# Patient Record
Sex: Female | Born: 1935
Health system: Southern US, Community
[De-identification: ages and names within clinical notes are randomized; demographics above are authoritative.]

## PROBLEM LIST (undated history)

## (undated) DIAGNOSIS — M21372 Foot drop, left foot: Secondary | ICD-10-CM

## (undated) DIAGNOSIS — I4891 Unspecified atrial fibrillation: Secondary | ICD-10-CM

## (undated) DIAGNOSIS — G629 Polyneuropathy, unspecified: Secondary | ICD-10-CM

## (undated) DIAGNOSIS — G459 Transient cerebral ischemic attack, unspecified: Secondary | ICD-10-CM

## (undated) DIAGNOSIS — J449 Chronic obstructive pulmonary disease, unspecified: Secondary | ICD-10-CM

## (undated) HISTORY — PX: JOINT REPLACEMENT: SHX530

## (undated) HISTORY — PX: BACK SURGERY: SHX140

## (undated) HISTORY — PX: HIP SURGERY: SHX245

---

## 2017-05-03 ENCOUNTER — Emergency Department (HOSPITAL_COMMUNITY): Payer: Medicare Other

## 2017-05-03 ENCOUNTER — Inpatient Hospital Stay (HOSPITAL_COMMUNITY)
Admission: EM | Admit: 2017-05-03 | Discharge: 2017-05-11 | DRG: 690 | Disposition: A | Discharge status: Skilled Nursing Facility | Payer: Medicare Other | Attending: Internal Medicine | Admitting: Internal Medicine

## 2017-05-03 ENCOUNTER — Encounter (HOSPITAL_COMMUNITY): Payer: Self-pay | Admitting: *Deleted

## 2017-05-03 DIAGNOSIS — S43004A Unspecified dislocation of right shoulder joint, initial encounter: Secondary | ICD-10-CM

## 2017-05-03 DIAGNOSIS — Z823 Family history of stroke: Secondary | ICD-10-CM

## 2017-05-03 DIAGNOSIS — W010XXA Fall on same level from slipping, tripping and stumbling without subsequent striking against object, initial encounter: Secondary | ICD-10-CM | POA: Diagnosis present

## 2017-05-03 DIAGNOSIS — N3000 Acute cystitis without hematuria: Principal | ICD-10-CM | POA: Diagnosis present

## 2017-05-03 DIAGNOSIS — S43035A Inferior dislocation of left humerus, initial encounter: Secondary | ICD-10-CM | POA: Diagnosis present

## 2017-05-03 DIAGNOSIS — J449 Chronic obstructive pulmonary disease, unspecified: Secondary | ICD-10-CM | POA: Diagnosis present

## 2017-05-03 DIAGNOSIS — R471 Dysarthria and anarthria: Secondary | ICD-10-CM | POA: Diagnosis not present

## 2017-05-03 DIAGNOSIS — D72829 Elevated white blood cell count, unspecified: Secondary | ICD-10-CM

## 2017-05-03 DIAGNOSIS — Z66 Do not resuscitate: Secondary | ICD-10-CM | POA: Diagnosis present

## 2017-05-03 DIAGNOSIS — G9341 Metabolic encephalopathy: Secondary | ICD-10-CM | POA: Diagnosis present

## 2017-05-03 DIAGNOSIS — T424X5A Adverse effect of benzodiazepines, initial encounter: Secondary | ICD-10-CM | POA: Diagnosis not present

## 2017-05-03 DIAGNOSIS — G629 Polyneuropathy, unspecified: Secondary | ICD-10-CM | POA: Diagnosis present

## 2017-05-03 DIAGNOSIS — S43005A Unspecified dislocation of left shoulder joint, initial encounter: Secondary | ICD-10-CM

## 2017-05-03 DIAGNOSIS — G5693 Unspecified mononeuropathy of bilateral upper limbs: Secondary | ICD-10-CM

## 2017-05-03 DIAGNOSIS — I4581 Long QT syndrome: Secondary | ICD-10-CM | POA: Diagnosis present

## 2017-05-03 DIAGNOSIS — S143XXA Injury of brachial plexus, initial encounter: Secondary | ICD-10-CM | POA: Diagnosis present

## 2017-05-03 DIAGNOSIS — M21372 Foot drop, left foot: Secondary | ICD-10-CM | POA: Diagnosis present

## 2017-05-03 DIAGNOSIS — R451 Restlessness and agitation: Secondary | ICD-10-CM | POA: Clinically undetermined

## 2017-05-03 DIAGNOSIS — Y92481 Parking lot as the place of occurrence of the external cause: Secondary | ICD-10-CM

## 2017-05-03 DIAGNOSIS — S43006A Unspecified dislocation of unspecified shoulder joint, initial encounter: Secondary | ICD-10-CM

## 2017-05-03 DIAGNOSIS — W19XXXA Unspecified fall, initial encounter: Secondary | ICD-10-CM

## 2017-05-03 DIAGNOSIS — K5900 Constipation, unspecified: Secondary | ICD-10-CM | POA: Diagnosis not present

## 2017-05-03 DIAGNOSIS — Z882 Allergy status to sulfonamides status: Secondary | ICD-10-CM

## 2017-05-03 DIAGNOSIS — I472 Ventricular tachycardia: Secondary | ICD-10-CM | POA: Diagnosis not present

## 2017-05-03 DIAGNOSIS — F0390 Unspecified dementia without behavioral disturbance: Secondary | ICD-10-CM | POA: Diagnosis present

## 2017-05-03 DIAGNOSIS — R4701 Aphasia: Secondary | ICD-10-CM | POA: Diagnosis not present

## 2017-05-03 DIAGNOSIS — I69354 Hemiplegia and hemiparesis following cerebral infarction affecting left non-dominant side: Secondary | ICD-10-CM

## 2017-05-03 DIAGNOSIS — N390 Urinary tract infection, site not specified: Secondary | ICD-10-CM | POA: Diagnosis not present

## 2017-05-03 DIAGNOSIS — Z7902 Long term (current) use of antithrombotics/antiplatelets: Secondary | ICD-10-CM

## 2017-05-03 DIAGNOSIS — F05 Delirium due to known physiological condition: Secondary | ICD-10-CM | POA: Clinically undetermined

## 2017-05-03 DIAGNOSIS — E785 Hyperlipidemia, unspecified: Secondary | ICD-10-CM | POA: Diagnosis present

## 2017-05-03 DIAGNOSIS — Z880 Allergy status to penicillin: Secondary | ICD-10-CM

## 2017-05-03 DIAGNOSIS — R410 Disorientation, unspecified: Secondary | ICD-10-CM | POA: Diagnosis not present

## 2017-05-03 DIAGNOSIS — Z885 Allergy status to narcotic agent status: Secondary | ICD-10-CM

## 2017-05-03 DIAGNOSIS — I10 Essential (primary) hypertension: Secondary | ICD-10-CM | POA: Diagnosis present

## 2017-05-03 DIAGNOSIS — R4182 Altered mental status, unspecified: Secondary | ICD-10-CM

## 2017-05-03 DIAGNOSIS — E876 Hypokalemia: Secondary | ICD-10-CM | POA: Diagnosis present

## 2017-05-03 DIAGNOSIS — S43014A Anterior dislocation of right humerus, initial encounter: Secondary | ICD-10-CM | POA: Diagnosis present

## 2017-05-03 DIAGNOSIS — I471 Supraventricular tachycardia: Secondary | ICD-10-CM | POA: Diagnosis not present

## 2017-05-03 HISTORY — DX: Foot drop, left foot: M21.372

## 2017-05-03 HISTORY — DX: Polyneuropathy, unspecified: G62.9

## 2017-05-03 HISTORY — DX: Unspecified atrial fibrillation: I48.91

## 2017-05-03 HISTORY — DX: Transient cerebral ischemic attack, unspecified: G45.9

## 2017-05-03 HISTORY — DX: Chronic obstructive pulmonary disease, unspecified: J44.9

## 2017-05-03 LAB — I-STAT TROPONIN, ED: Troponin i, poc: 0.01 ng/mL (ref 0.00–0.08)

## 2017-05-03 LAB — I-STAT CHEM 8, ED
BUN: 19 mg/dL (ref 6–20)
CALCIUM ION: 1.17 mmol/L (ref 1.15–1.40)
Chloride: 105 mmol/L (ref 101–111)
Creatinine, Ser: 0.8 mg/dL (ref 0.44–1.00)
Glucose, Bld: 198 mg/dL — ABNORMAL HIGH (ref 65–99)
HEMATOCRIT: 36 % (ref 36.0–46.0)
HEMOGLOBIN: 12.2 g/dL (ref 12.0–15.0)
Potassium: 3.3 mmol/L — ABNORMAL LOW (ref 3.5–5.1)
SODIUM: 142 mmol/L (ref 135–145)
TCO2: 20 mmol/L (ref 0–100)

## 2017-05-03 LAB — URINALYSIS, ROUTINE W REFLEX MICROSCOPIC
Bilirubin Urine: NEGATIVE
Glucose, UA: 150 mg/dL — AB
HGB URINE DIPSTICK: NEGATIVE
Ketones, ur: 20 mg/dL — AB
Leukocytes, UA: NEGATIVE
NITRITE: POSITIVE — AB
PROTEIN: NEGATIVE mg/dL
RBC / HPF: NONE SEEN RBC/hpf (ref 0–5)
SPECIFIC GRAVITY, URINE: 1.014 (ref 1.005–1.030)
Squamous Epithelial / LPF: NONE SEEN
pH: 6 (ref 5.0–8.0)

## 2017-05-03 LAB — COMPREHENSIVE METABOLIC PANEL
ALT: 14 U/L (ref 14–54)
AST: 22 U/L (ref 15–41)
Albumin: 3.4 g/dL — ABNORMAL LOW (ref 3.5–5.0)
Alkaline Phosphatase: 75 U/L (ref 38–126)
Anion gap: 10 (ref 5–15)
BUN: 16 mg/dL (ref 6–20)
CALCIUM: 8.6 mg/dL — AB (ref 8.9–10.3)
CO2: 21 mmol/L — ABNORMAL LOW (ref 22–32)
CREATININE: 0.96 mg/dL (ref 0.44–1.00)
Chloride: 108 mmol/L (ref 101–111)
GFR, EST NON AFRICAN AMERICAN: 53 mL/min — AB (ref >60)
Glucose, Bld: 195 mg/dL — ABNORMAL HIGH (ref 65–99)
Potassium: 3.4 mmol/L — ABNORMAL LOW (ref 3.5–5.1)
SODIUM: 139 mmol/L (ref 135–145)
Total Bilirubin: 0.6 mg/dL (ref 0.3–1.2)
Total Protein: 5.7 g/dL — ABNORMAL LOW (ref 6.5–8.1)

## 2017-05-03 LAB — APTT: aPTT: 28 seconds (ref 24–36)

## 2017-05-03 LAB — ETHANOL: Alcohol, Ethyl (B): <5 mg/dL (ref <5)

## 2017-05-03 LAB — DIFFERENTIAL
Basophils Absolute: 0 10*3/uL (ref 0.0–0.1)
Basophils Relative: 0 %
EOS PCT: 3 %
Eosinophils Absolute: 0.2 10*3/uL (ref 0.0–0.7)
LYMPHS ABS: 1.3 10*3/uL (ref 0.7–4.0)
LYMPHS PCT: 17 %
Monocytes Absolute: 0.3 10*3/uL (ref 0.1–1.0)
Monocytes Relative: 4 %
NEUTROS ABS: 6.2 10*3/uL (ref 1.7–7.7)
NEUTROS PCT: 76 %

## 2017-05-03 LAB — CBC
HCT: 36.7 % (ref 36.0–46.0)
Hemoglobin: 12.3 g/dL (ref 12.0–15.0)
MCH: 29.1 pg (ref 26.0–34.0)
MCHC: 33.5 g/dL (ref 30.0–36.0)
MCV: 87 fL (ref 78.0–100.0)
PLATELETS: 264 10*3/uL (ref 150–400)
RBC: 4.22 MIL/uL (ref 3.87–5.11)
RDW: 13 % (ref 11.5–15.5)
WBC: 8.2 10*3/uL (ref 4.0–10.5)

## 2017-05-03 LAB — RAPID URINE DRUG SCREEN, HOSP PERFORMED
AMPHETAMINES: NOT DETECTED
BENZODIAZEPINES: NOT DETECTED
Barbiturates: NOT DETECTED
Cocaine: NOT DETECTED
OPIATES: NOT DETECTED
TETRAHYDROCANNABINOL: NOT DETECTED

## 2017-05-03 LAB — PROTIME-INR
INR: 1.01
PROTHROMBIN TIME: 13.3 s (ref 11.4–15.2)

## 2017-05-03 MED ORDER — ACETAMINOPHEN 650 MG RE SUPP: 650.0000 mg | Freq: Four times a day (QID) | RECTAL | Status: DC | PRN

## 2017-05-03 MED ORDER — SODIUM CHLORIDE 0.9 % IV BOLUS (SEPSIS)
500.0000 mL | Freq: Once | INTRAVENOUS | Status: AC
  Administered 2017-05-03: 500 mL via INTRAVENOUS

## 2017-05-03 MED ORDER — LABETALOL HCL 5 MG/ML IV SOLN
10.0000 mg | INTRAVENOUS | Status: DC | PRN
  Filled 2017-05-03: qty 4

## 2017-05-03 MED ORDER — SODIUM CHLORIDE 0.9 % IV SOLN
INTRAVENOUS | Status: AC
  Administered 2017-05-03: 20:00:00 via INTRAVENOUS

## 2017-05-03 MED ORDER — HYDROCODONE-ACETAMINOPHEN 5-325 MG PO TABS
1.0000 | ORAL_TABLET | ORAL | Status: DC | PRN
  Administered 2017-05-04: 1 via ORAL
  Filled 2017-05-03: qty 1

## 2017-05-03 MED ORDER — DEXTROSE 5 % IV SOLN
1.0000 g | Freq: Every day | INTRAVENOUS | Status: DC
  Administered 2017-05-04 – 2017-05-07 (×5): 1 g via INTRAVENOUS
  Filled 2017-05-03 (×6): qty 10

## 2017-05-03 MED ORDER — POTASSIUM CHLORIDE CRYS ER 20 MEQ PO TBCR
40.0000 meq | EXTENDED_RELEASE_TABLET | Freq: Once | ORAL | Status: AC
  Administered 2017-05-04: 40 meq via ORAL
  Filled 2017-05-03: qty 2

## 2017-05-03 MED ORDER — ONDANSETRON HCL 4 MG PO TABS: 4.0000 mg | ORAL_TABLET | Freq: Four times a day (QID) | ORAL | Status: DC | PRN

## 2017-05-03 MED ORDER — PROPOFOL 10 MG/ML IV BOLUS
0.5000 mg/kg | Freq: Once | INTRAVENOUS | Status: DC | PRN
  Administered 2017-05-03: 35.9 mg via INTRAVENOUS
  Filled 2017-05-03: qty 20

## 2017-05-03 MED ORDER — SODIUM CHLORIDE 0.9% FLUSH
3.0000 mL | Freq: Two times a day (BID) | INTRAVENOUS | Status: DC
  Administered 2017-05-04 – 2017-05-10 (×14): 3 mL via INTRAVENOUS

## 2017-05-03 MED ORDER — HYDROMORPHONE HCL 1 MG/ML IJ SOLN
0.5000 mg | Freq: Once | INTRAMUSCULAR | Status: AC
Start: 2017-05-03 — End: 2017-05-03
  Administered 2017-05-03: 0.5 mg via INTRAVENOUS
  Filled 2017-05-03: qty 1

## 2017-05-03 MED ORDER — PROPOFOL 10 MG/ML IV BOLUS
INTRAVENOUS | Status: AC | PRN
  Administered 2017-05-03: 40 mg via INTRAVENOUS

## 2017-05-03 MED ORDER — ACETAMINOPHEN 325 MG PO TABS
650.0000 mg | ORAL_TABLET | Freq: Four times a day (QID) | ORAL | Status: DC | PRN
  Administered 2017-05-11: 650 mg via ORAL
  Filled 2017-05-03: qty 2

## 2017-05-03 MED ORDER — ONDANSETRON HCL 4 MG/2ML IJ SOLN: 4.0000 mg | Freq: Four times a day (QID) | INTRAMUSCULAR | Status: DC | PRN

## 2017-05-03 MED ORDER — MAGNESIUM SULFATE IN D5W 1-5 GM/100ML-% IV SOLN
1.0000 g | Freq: Once | INTRAVENOUS | Status: AC
  Administered 2017-05-04: 1 g via INTRAVENOUS
  Filled 2017-05-03 (×2): qty 100

## 2017-05-03 NOTE — Sedation Documentation (Signed)
R shoulder reduction

## 2017-05-03 NOTE — Progress Notes (Signed)
RRT at bedside for conscious sedation for a bilateral shoulder reduction. No complications noted.

## 2017-05-03 NOTE — Sedation Documentation (Signed)
Left shoulder reduction

## 2017-05-03 NOTE — H&P (Signed)
History and Physical    Kristin Conrad AVW:[id number] DOB: [birth year] DOA: 05/03/2017  PCP: System, Pcp Not In   Patient coming from: Brought in by EMS  Chief Complaint: Fall in the parking lot of [employer]  HPI: Kristin Conrad is a 83 y.o. woman with a history of TIA, COPD, peripheral neuropathy, and left foot drop who was in her baseline state of health until she had a mechanical fall outside of [employer] today.  The fall was witnessed by her daughter, who reports that the patient has chronic bilateral lower extremity weakness.  There was no loss of consciousness.  No convulsions or seizure-like activity.  No bowel or bladder incontinence.  She immediately had bilateral shoulder pain; she reports that she continued to hold on to the shopping cart while she was going to the ground.  EMS was called.  She received fentanyl and NS 500cc en route.  ED Course: EKG shows NSR; no acute ST elevation.  CT head and cervical spine negative for acute process.  Xray showed bilateral shoulder dislocations.  U/A is nitrite positive with many bacteria.  Normal CBC.  Potassium 3.4.  UDS negative.    Both shoulders have been reduced in the ED, with conscious sedation.  She has had mild delirium in the ED.  She will need to have her arms in slings.  There is concern for safety at home (ability to climbs stairs, perform ADLs).    IV Rocephin, oral potassium, and empiric magnesium ordered.  Hospitalist asked to place in observation.  PT and OT evaluations pending for the morning.  Review of Systems: As per HPI otherwise 10 systems reviewed and negative.   Past Medical History:  Diagnosis Date  . COPD (chronic obstructive pulmonary disease) (HCC)   . Left foot drop   . Lone atrial fibrillation (HCC)   . Peripheral neuropathy   . TIA (transient ischemic attack)     Past Surgical History:  Procedure Laterality Date  . BACK SURGERY    . HIP SURGERY    . JOINT REPLACEMENT    She has had cervical and  lumbar spine surgeries.   reports that she has never smoked. She has never used smokeless tobacco. She reports that she does not drink alcohol or use drugs.  She is a widow.  She has three adult children.  Allergies  Allergen Reactions  . Codeine   . Penicillins Other (See Comments)    childhood  . Sulfa Antibiotics Other (See Comments)    Childhood     Family History  Problem Relation Age of Onset  . Stroke Father      Prior to Admission medications   Not on File  Home medications reviewed after they were entered by pharmacy.  Physical Exam: Vitals:   05/03/17 2100 05/03/17 2130 05/03/17 2200 05/03/17 2230  BP: (!) 187/98 (!) 181/88 (!) 159/83 (!) 176/93  Pulse: (!) 103 (!) 101 99 (!) 103  Resp: 16 (!) 26 13 19   Temp:      SpO2: 100% 97% 99% 98%  Weight:      Height:          Constitutional: NAD, calm, comfortable, mild delirium after conscious sedation for bilateral shoulder reduction Vitals:   05/03/17 2100 05/03/17 2130 05/03/17 2200 05/03/17 2230  BP: (!) 187/98 (!) 181/88 (!) 159/83 (!) 176/93  Pulse: (!) 103 (!) 101 99 (!) 103  Resp: 16 (!) 26 13 19   Temp:      SpO2: 100% 97%  99% 98%  Weight:      Height:       Eyes: PERRL, lids and conjunctivae normal ENMT: Mucous membranes are very dry. Posterior pharynx not completely visualized. Normal dentition.  Neck: normal appearance, supple, no masses Respiratory: clear to auscultation listening anteriorly.  No wheezing.  Normal respiratory effort. No accessory muscle use.  Cardiovascular: Tachycardic but regular.  No murmurs / rubs / gallops. No extremity edema. 2+ pedal pulses.  GI: abdomen is soft and compressible.  No distention.  No tenderness.  Bowel sounds are present. Musculoskeletal:  No joint deformity in upper and lower extremities. Reduced ROM in bilateral upper extremities.  No contractures. Normal muscle tone.  Skin: no rashes, warm and dry Neurologic: No focal deficits. Psychiatric: Mildly  confused (after conscious sedation) in the ED but mood appropriate.    Labs on Admission: I have personally reviewed following labs and imaging studies  CBC:  Recent Labs Lab 05/03/17 1657 05/03/17 1709  WBC 8.2  --   NEUTROABS 6.2  --   HGB 12.3 12.2  HCT 36.7 36.0  MCV 87.0  --   PLT 264  --    Basic Metabolic Panel:  Recent Labs Lab 05/03/17 1657 05/03/17 1709  NA 139 142  K 3.4* 3.3*  CL 108 105  CO2 21*  --   GLUCOSE 195* 198*  BUN 16 19  CREATININE 0.96 0.80  CALCIUM 8.6*  --    GFR: Estimated Creatinine Clearance: 49.7 mL/min (by C-G formula based on SCr of 0.8 mg/dL). Liver Function Tests:  Recent Labs Lab 05/03/17 1657  AST 22  ALT 14  ALKPHOS 75  BILITOT 0.6  PROT 5.7*  ALBUMIN 3.4*   Coagulation Profile:  Recent Labs Lab 05/03/17 1657  INR 1.01   Urine analysis:    Component Value Date/Time   COLORURINE YELLOW 05/03/2017 1820   APPEARANCEUR HAZY (A) 05/03/2017 1820   LABSPEC 1.014 05/03/2017 1820   PHURINE 6.0 05/03/2017 1820   GLUCOSEU 150 (A) 05/03/2017 1820   HGBUR NEGATIVE 05/03/2017 1820   BILIRUBINUR NEGATIVE 05/03/2017 1820   KETONESUR 20 (A) 05/03/2017 1820   PROTEINUR NEGATIVE 05/03/2017 1820   NITRITE POSITIVE (A) 05/03/2017 1820   LEUKOCYTESUR NEGATIVE 05/03/2017 1820    Radiological Exams on Admission: Dg Shoulder Right  Result Date: 05/03/2017 CLINICAL DATA:  Dislocation, postreduction. EXAM: RIGHT SHOULDER - 2+ VIEW COMPARISON:  Pre reduction exam earlier this day. FINDINGS: Previous dislocation of the right shoulder is been reduced, alignment appears anatomic. No large Hill-Sachs impaction fracture. Acromioclavicular joint is congruent. IMPRESSION: Reduction of prior anterior shoulder dislocation. No fracture or Hill-Sachs impaction injury is visualized. Electronically Signed   By: Rubye Oaks M.D.   On: 05/03/2017 22:14   Dg Shoulder Right  Addendum Date: 05/03/2017   ADDENDUM REPORT: 05/03/2017 19:54  ADDENDUM: Corrected report: IMPRESSION: Dislocation of the right shoulder. Electronically Signed   By: Norva Pavlov M.D.   On: 05/03/2017 19:54   Result Date: 05/03/2017 CLINICAL DATA:  Pain both shoulders s/p fall. Unable to get y views or transthoracic. Due to pt.'s condition could only get ap images EXAM: RIGHT SHOULDER - 2+ VIEW COMPARISON:  None. FINDINGS: There is anterior dislocation of the right shoulder. Mechanism is appropriate for Hill-Sachs deformity. No acute fracture identified. Right lung apex is clear. IMPRESSION: Negative. Electronically Signed: By: Norva Pavlov M.D. On: 05/03/2017 19:33   Ct Head Wo Contrast  Result Date: 05/03/2017 CLINICAL DATA:  Pt fell today , Tripped at  [employer] and landed on hands, chin and chest. EXAM: CT HEAD WITHOUT CONTRAST CT CERVICAL SPINE WITHOUT CONTRAST TECHNIQUE: Multidetector CT imaging of the head and cervical spine was performed following the standard protocol without intravenous contrast. Multiplanar CT image reconstructions of the cervical spine were also generated. COMPARISON:  None. FINDINGS: CT HEAD FINDINGS Brain: No evidence of acute infarction, hemorrhage, hydrocephalus, extra-axial collection or mass lesion/mass effect. The ventricles are normal configuration. There is ventricular and sulcal enlargement reflecting mild diffuse atrophy. Patchy white matter hypoattenuation is also noted consistent with moderate chronic microvascular ischemic change. Vascular: No hyperdense vessel or unexpected calcification. Skull: Normal. Negative for fracture or focal lesion. Sinuses/Orbits: Globes and orbits are unremarkable. Visualized sinuses and mastoid air cells are clear. Other: None. CT CERVICAL SPINE FINDINGS Alignment: There is a grade 1 anterolisthesis of C5 on C6 and of C6 on C7. No other spondylolisthesis. Skull base and vertebrae: No acute fracture. No primary bone lesion or focal pathologic process. Soft tissues and spinal canal: No prevertebral  fluid or swelling. No visible canal hematoma. Disc levels: Bilateral laminectomies have been performed from C3 through C6. There is mild loss of disc height at C5-C6 with moderate to marked loss of disc height at C6-C7. Mild to moderate loss disc height at C7-T1. There are facet degenerative changes bilaterally throughout cervical spine neural foraminal narrowing is noted at multiple levels greatest bilaterally at C6-C7, severe. There is no convincing disc herniation. Upper chest: No acute findings. There are nodule arising from the left thyroid lobe, largest measuring 2.3 cm. Right lobe is not visualized and may have been surgically removed. Other: None IMPRESSION: HEAD CT:  No acute intracranial abnormalities.  No skull fracture. CERVICAL CT: No fracture or acute finding. Degenerative and postsurgical changes as detailed. Electronically Signed   By: Amie Portland M.D.   On: 05/03/2017 18:37   Ct Cervical Spine Wo Contrast  Result Date: 05/03/2017 CLINICAL DATA:  Pt fell today , Tripped at [employer] and landed on hands, chin and chest. EXAM: CT HEAD WITHOUT CONTRAST CT CERVICAL SPINE WITHOUT CONTRAST TECHNIQUE: Multidetector CT imaging of the head and cervical spine was performed following the standard protocol without intravenous contrast. Multiplanar CT image reconstructions of the cervical spine were also generated. COMPARISON:  None. FINDINGS: CT HEAD FINDINGS Brain: No evidence of acute infarction, hemorrhage, hydrocephalus, extra-axial collection or mass lesion/mass effect. The ventricles are normal configuration. There is ventricular and sulcal enlargement reflecting mild diffuse atrophy. Patchy white matter hypoattenuation is also noted consistent with moderate chronic microvascular ischemic change. Vascular: No hyperdense vessel or unexpected calcification. Skull: Normal. Negative for fracture or focal lesion. Sinuses/Orbits: Globes and orbits are unremarkable. Visualized sinuses and mastoid air cells are  clear. Other: None. CT CERVICAL SPINE FINDINGS Alignment: There is a grade 1 anterolisthesis of C5 on C6 and of C6 on C7. No other spondylolisthesis. Skull base and vertebrae: No acute fracture. No primary bone lesion or focal pathologic process. Soft tissues and spinal canal: No prevertebral fluid or swelling. No visible canal hematoma. Disc levels: Bilateral laminectomies have been performed from C3 through C6. There is mild loss of disc height at C5-C6 with moderate to marked loss of disc height at C6-C7. Mild to moderate loss disc height at C7-T1. There are facet degenerative changes bilaterally throughout cervical spine neural foraminal narrowing is noted at multiple levels greatest bilaterally at C6-C7, severe. There is no convincing disc herniation. Upper chest: No acute findings. There are nodule arising from the left thyroid lobe,  largest measuring 2.3 cm. Right lobe is not visualized and may have been surgically removed. Other: None IMPRESSION: HEAD CT:  No acute intracranial abnormalities.  No skull fracture. CERVICAL CT: No fracture or acute finding. Degenerative and postsurgical changes as detailed. Electronically Signed   By: Amie Portlandavid  Ormond M.D.   On: 05/03/2017 18:37   Dg Shoulder Left  Result Date: 05/03/2017 CLINICAL DATA:  Dislocation, postreduction. EXAM: LEFT SHOULDER - 2+ VIEW COMPARISON:  Pre reduction radiographs earlier this day. FINDINGS: Shoulder dislocation has been reduced, alignment is now anatomic. The previous questioned glenoid fracture on prior exam is not visualized, may be obscured by osseous overlap. Acromioclavicular joint is congruent. IMPRESSION: Anatomic alignment postreduction. The previous questioned glenoid fracture is not visualized, may be obscured by osseous overlap. Electronically Signed   By: Rubye OaksMelanie  Ehinger M.D.   On: 05/03/2017 22:16   Dg Shoulder Left  Result Date: 05/03/2017 CLINICAL DATA:  Fall, pain EXAM: LEFT SHOULDER - 2+ VIEW COMPARISON:  None.  FINDINGS: AC joint appears intact.  The left lung apex is clear. Inferior dislocation of the left humeral head with respect to the glenoid fossa. Possible cortical fracture along the mid to inferior glenoid rim. IMPRESSION: Inferior dislocation of the left humeral head with respect to the glenoid fossa. Possible small fracture fragments along the mid to inferior glenoid fossa. Electronically Signed   By: Jasmine PangKim  Fujinaga M.D.   On: 05/03/2017 19:34    EKG: Independently reviewed. Noted above.  Assessment/Plan Principal Problem:   Fall Active Problems:   Shoulder dislocation   Hypokalemia   COPD (chronic obstructive pulmonary disease) (HCC)   Acute lower UTI   Delirium      Mechanical fall with bilateral shoulder dislocation S/P reduction with conscious sedation --Observation with telemetry --PT and OT eval and treat --Analgesics as needed  UTI --Rocephin --Urine culture  Hypokalemia --Replacement ordered in the ED with empiric magnesium 1 gram IV --BMP in the AM  Peripheral neuropathy --Neurontin, flexeril, celebrex  Prior TIA --Plavix three times per week   DVT prophylaxis: Low risk, outpatient status Code Status: DNR Family Communication: Daughter present in the ED at time of admission. Disposition Plan: To be determined. Consults called: NONE Admission status: Place in observation with telemetry monitoring   TIME SPENT: 60 minutes   Jerene Bearsarter,Karlis Cregg Harrison MD [facility] Pager [phone]  If 7PM-7AM, please contact night-coverage [email or web address] Password [email or web address]  05/03/2017, 11:26 PM

## 2017-05-03 NOTE — Sedation Documentation (Signed)
Patient denies pain and is resting comfortably.  

## 2017-05-03 NOTE — ED Notes (Signed)
EKG given to Dr. Mesner. 

## 2017-05-03 NOTE — ED Notes (Signed)
Attempted report 

## 2017-05-03 NOTE — ED Provider Notes (Signed)
[facility]-EMERGENCY DEPT Provider Note   CSN: [id number] Arrival date & time: 05/03/17  1524     History   Chief Complaint Chief Complaint  Patient presents with  . Fall    HPI Kristin Conrad is a 83 y.o. female presenting via EMS after a slip and fall outside of [employer]. Patient landed on her hands, and chest and was given fentanyl and fluids in route.  Daughter is in the room explaining that she witnessed her as she was pushing the cart outside of the store slowly sliding forward and hyperextending her back. She has a history of possible stroke 2 months ago with left-sided weakness residual, she is now on plavix. She has had left drop foot for 9 years. She reports bilateral arm and shoulder pains as well as upper back pain between her scapula. She denies neck pain, loss of consciousness, head trauma. Denies dizziness, nausea, vomiting, numbness or other symptoms.   HPI  Past Medical History:  Diagnosis Date  . Stroke [facility])    tia    There are no active problems to display for this patient.   Past Surgical History:  Procedure Laterality Date  . BACK SURGERY    . JOINT REPLACEMENT      OB History    No data available       Home Medications    Prior to Admission medications   Not on File    Family History No family history on file.  Social History Social History  Substance Use Topics  . Smoking status: Never Smoker  . Smokeless tobacco: Never Used  . Alcohol use No     Allergies   Patient has no known allergies.   Review of Systems Review of Systems  Constitutional: Negative for chills and fever.  HENT: Negative for ear pain, facial swelling and sore throat.   Eyes: Negative for pain and visual disturbance.  Respiratory: Negative for cough, shortness of breath, wheezing and stridor.   Cardiovascular: Negative for chest pain, palpitations and leg swelling.  Gastrointestinal: Negative for abdominal distention, abdominal pain, nausea and vomiting.   Denies loss of bowel function  Genitourinary:       Denies loss of bladder function  Musculoskeletal: Positive for arthralgias, gait problem and myalgias. Negative for back pain, joint swelling, neck pain and neck stiffness.  Skin: Negative for color change, pallor, rash and wound.  Neurological: Positive for weakness. Negative for dizziness, tremors, seizures, syncope, facial asymmetry, speech difficulty, light-headedness, numbness and headaches.     Physical Exam Updated Vital Signs BP (!) 173/78   Pulse 97   Temp 98 F (36.7 C)   Resp 16   Ht 5\' 3"  (1.6 m)   Wt 71.7 kg   SpO2 96%   BMI 27.99 kg/m   Physical Exam  Constitutional: She is oriented to person, place, and time. She appears well-developed and well-nourished. No distress.  HENT:  Head: Normocephalic and atraumatic.  Mouth/Throat: Oropharynx is clear and moist. No oropharyngeal exudate.  Eyes: Conjunctivae and EOM are normal. Pupils are equal, round, and reactive to light. Right eye exhibits no discharge. Left eye exhibits no discharge. No scleral icterus.  Neck: Normal range of motion. Neck supple.  Cardiovascular: Normal rate, regular rhythm, normal heart sounds and intact distal pulses.   No murmur heard. Pulmonary/Chest: Effort normal and breath sounds normal. No respiratory distress. She has no wheezes. She has no rales. She exhibits no tenderness.  Abdominal: Soft. She exhibits no distension and no mass. There  is no tenderness. There is no guarding.  Musculoskeletal: She exhibits tenderness. She exhibits no edema.  Neurological: She is alert and oriented to person, place, and time. No cranial nerve deficit or sensory deficit. She exhibits normal muscle tone.  Neurologic Exam:  - Mental status: Patient is alert and cooperative. Fluent speech and words are clear. Coherent thought processes and insight is good. Patient is oriented x 4 to person, place, time and event.  - Cranial nerves:  CN III, IV, VI: pupils  equally round, reactive to light both direct and conscensual and normal accommodation. Full extra-ocular movement.  CN VII : muscles of facial expression intact. CN X :  midline uvula. XI strength of sternocleidomastoid and trapezius muscles 5/5, XII: tongue is midline when protruded.  - Motor: No involuntary movements. Muscle tone and bulk normal throughout. Muscle strength is 5/5 in bilateral  hip extension, flexion, leg flexion and extension, ankle dorsiflexion (R) and plantar flexion.   Unable to dorsiflex left foot, baseline for patient. Bilateral weakness with grips R>L. unable to assess shoulder strength due to pain. - Sensory: Proprioception, light tough sensation intact in all extremities.    Skin: Skin is warm and dry. No rash noted. She is not diaphoretic. No erythema. No pallor.  Psychiatric: She has a normal mood and affect.  Nursing note and vitals reviewed.    ED Treatments / Results  Labs (all labs ordered are listed, but only abnormal results are displayed) Labs Reviewed  COMPREHENSIVE METABOLIC PANEL - Abnormal; Notable for the following:       Result Value   Potassium 3.4 (*)    CO2 21 (*)    Glucose, Bld 195 (*)    Calcium 8.6 (*)    Total Protein 5.7 (*)    Albumin 3.4 (*)    GFR calc non Af Amer 53 (*)    All other components within normal limits  URINALYSIS, ROUTINE W REFLEX MICROSCOPIC - Abnormal; Notable for the following:    APPearance HAZY (*)    Glucose, UA 150 (*)    Ketones, ur 20 (*)    Nitrite POSITIVE (*)    Bacteria, UA MANY (*)    All other components within normal limits  I-STAT CHEM 8, ED - Abnormal; Notable for the following:    Potassium 3.3 (*)    Glucose, Bld 198 (*)    All other components within normal limits  CBC  ETHANOL  PROTIME-INR  APTT  RAPID URINE DRUG SCREEN, HOSP PERFORMED  DIFFERENTIAL  I-STAT TROPOININ, ED    EKG  EKG Interpretation  Date/Time:  Monday May 03 2017 16:40:08 EDT Ventricular Rate:  89 PR  Interval:    QRS Duration: 102 QT Interval:  419 QTC Calculation: 510 R Axis:   -6 Text Interpretation:  Sinus rhythm Low voltage, precordial leads Abnormal R-wave progression, early transition Prolonged QT interval No old tracing to compare Confirmed by [facility] MD, JASON [phone]) on 05/03/2017 4:59:36 PM       Radiology Dg Shoulder Right  Addendum Date: 05/03/2017   ADDENDUM REPORT: 05/03/2017 19:54 ADDENDUM: Corrected report: IMPRESSION: Dislocation of the right shoulder. Electronically Signed   By: Norva Pavlov M.D.   On: 05/03/2017 19:54   Result Date: 05/03/2017 CLINICAL DATA:  Pain both shoulders s/p fall. Unable to get y views or transthoracic. Due to pt.'s condition could only get ap images EXAM: RIGHT SHOULDER - 2+ VIEW COMPARISON:  None. FINDINGS: There is anterior dislocation of the right shoulder. Mechanism is  appropriate for Hill-Sachs deformity. No acute fracture identified. Right lung apex is clear. IMPRESSION: Negative. Electronically Signed: By: Norva Pavlov M.D. On: 05/03/2017 19:33   Ct Head Wo Contrast  Result Date: 05/03/2017 CLINICAL DATA:  Pt fell today , Tripped at [employer] and landed on hands, chin and chest. EXAM: CT HEAD WITHOUT CONTRAST CT CERVICAL SPINE WITHOUT CONTRAST TECHNIQUE: Multidetector CT imaging of the head and cervical spine was performed following the standard protocol without intravenous contrast. Multiplanar CT image reconstructions of the cervical spine were also generated. COMPARISON:  None. FINDINGS: CT HEAD FINDINGS Brain: No evidence of acute infarction, hemorrhage, hydrocephalus, extra-axial collection or mass lesion/mass effect. The ventricles are normal configuration. There is ventricular and sulcal enlargement reflecting mild diffuse atrophy. Patchy white matter hypoattenuation is also noted consistent with moderate chronic microvascular ischemic change. Vascular: No hyperdense vessel or unexpected calcification. Skull: Normal. Negative for  fracture or focal lesion. Sinuses/Orbits: Globes and orbits are unremarkable. Visualized sinuses and mastoid air cells are clear. Other: None. CT CERVICAL SPINE FINDINGS Alignment: There is a grade 1 anterolisthesis of C5 on C6 and of C6 on C7. No other spondylolisthesis. Skull base and vertebrae: No acute fracture. No primary bone lesion or focal pathologic process. Soft tissues and spinal canal: No prevertebral fluid or swelling. No visible canal hematoma. Disc levels: Bilateral laminectomies have been performed from C3 through C6. There is mild loss of disc height at C5-C6 with moderate to marked loss of disc height at C6-C7. Mild to moderate loss disc height at C7-T1. There are facet degenerative changes bilaterally throughout cervical spine neural foraminal narrowing is noted at multiple levels greatest bilaterally at C6-C7, severe. There is no convincing disc herniation. Upper chest: No acute findings. There are nodule arising from the left thyroid lobe, largest measuring 2.3 cm. Right lobe is not visualized and may have been surgically removed. Other: None IMPRESSION: HEAD CT:  No acute intracranial abnormalities.  No skull fracture. CERVICAL CT: No fracture or acute finding. Degenerative and postsurgical changes as detailed. Electronically Signed   By: Amie Portland M.D.   On: 05/03/2017 18:37   Ct Cervical Spine Wo Contrast  Result Date: 05/03/2017 CLINICAL DATA:  Pt fell today , Tripped at [employer] and landed on hands, chin and chest. EXAM: CT HEAD WITHOUT CONTRAST CT CERVICAL SPINE WITHOUT CONTRAST TECHNIQUE: Multidetector CT imaging of the head and cervical spine was performed following the standard protocol without intravenous contrast. Multiplanar CT image reconstructions of the cervical spine were also generated. COMPARISON:  None. FINDINGS: CT HEAD FINDINGS Brain: No evidence of acute infarction, hemorrhage, hydrocephalus, extra-axial collection or mass lesion/mass effect. The ventricles are normal  configuration. There is ventricular and sulcal enlargement reflecting mild diffuse atrophy. Patchy white matter hypoattenuation is also noted consistent with moderate chronic microvascular ischemic change. Vascular: No hyperdense vessel or unexpected calcification. Skull: Normal. Negative for fracture or focal lesion. Sinuses/Orbits: Globes and orbits are unremarkable. Visualized sinuses and mastoid air cells are clear. Other: None. CT CERVICAL SPINE FINDINGS Alignment: There is a grade 1 anterolisthesis of C5 on C6 and of C6 on C7. No other spondylolisthesis. Skull base and vertebrae: No acute fracture. No primary bone lesion or focal pathologic process. Soft tissues and spinal canal: No prevertebral fluid or swelling. No visible canal hematoma. Disc levels: Bilateral laminectomies have been performed from C3 through C6. There is mild loss of disc height at C5-C6 with moderate to marked loss of disc height at C6-C7. Mild to moderate loss disc height  at C7-T1. There are facet degenerative changes bilaterally throughout cervical spine neural foraminal narrowing is noted at multiple levels greatest bilaterally at C6-C7, severe. There is no convincing disc herniation. Upper chest: No acute findings. There are nodule arising from the left thyroid lobe, largest measuring 2.3 cm. Right lobe is not visualized and may have been surgically removed. Other: None IMPRESSION: HEAD CT:  No acute intracranial abnormalities.  No skull fracture. CERVICAL CT: No fracture or acute finding. Degenerative and postsurgical changes as detailed. Electronically Signed   By: Amie Portland M.D.   On: 05/03/2017 18:37   Dg Shoulder Left  Result Date: 05/03/2017 CLINICAL DATA:  Fall, pain EXAM: LEFT SHOULDER - 2+ VIEW COMPARISON:  None. FINDINGS: AC joint appears intact.  The left lung apex is clear. Inferior dislocation of the left humeral head with respect to the glenoid fossa. Possible cortical fracture along the mid to inferior glenoid  rim. IMPRESSION: Inferior dislocation of the left humeral head with respect to the glenoid fossa. Possible small fracture fragments along the mid to inferior glenoid fossa. Electronically Signed   By: Jasmine Pang M.D.   On: 05/03/2017 19:34    Procedures Procedures (including critical care time) Reduction of dislocation Date/Time: 8:43 PM Performed by: Georgiana Shore / Dr. Roselyn Bering Authorized by: Georgiana Shore / Dr. Lynelle Doctor Consent: Verbal consent obtained. Risks and benefits: risks, benefits and alternatives were discussed Consent given by: patient Required items: required blood products, implants, devices, and special equipment available Time out: Immediately prior to procedure a "time out" was called to verify the correct patient, procedure, equipment, support staff and site/side marked as required.  Patient sedated: yes  Vitals: Vital signs were monitored during sedation. Patient tolerance: Patient tolerated the procedure well with no immediate complications. Joint: right shoulder Reduction technique: traction/countertraction  Reduction of dislocation Date/Time: 8:43 PM Performed by: Georgiana Shore / Dr Roselyn Bering Authorized by: Georgiana Shore / Dr. Roselyn Bering Consent: Verbal consent obtained. Risks and benefits: risks, benefits and alternatives were discussed Consent given by: patient Required items: required blood products, implants, devices, and special equipment available Time out: Immediately prior to procedure a "time out" was called to verify the correct patient, procedure, equipment, support staff and site/side marked as required.  Patient sedated: yes  Vitals: Vital signs were monitored during sedation. Patient tolerance: Patient tolerated the procedure well with no immediate complications. Joint: left shoulder Reduction technique: traction/countertraction    Medications Ordered in ED Medications  0.9 %  sodium chloride infusion ( Intravenous New  Bag/Given 05/03/17 2008)  propofol (DIPRIVAN) 10 mg/mL bolus/IV push 35.9 mg (35.9 mg Intravenous Given 05/03/17 2029)  propofol (DIPRIVAN) 10 mg/mL bolus/IV push (40 mg Intravenous Given 05/03/17 2033)  HYDROmorphone (DILAUDID) injection 0.5 mg (0.5 mg Intravenous Given 05/03/17 1658)  sodium chloride 0.9 % bolus 500 mL (500 mLs Intravenous New Bag/Given 05/03/17 2008)     Initial Impression / Assessment and Plan / ED Course  I have reviewed the triage vital signs and the nursing notes.  Pertinent labs & imaging results that were available during my care of the patient were reviewed by me and considered in my medical decision making (see chart for details).  Clinical Course as of May 03 2042  Mon May 03, 2017  1942 DG Shoulder Right [JM]    Clinical Course User Index [JM] Georgiana Shore, [place]   Patient presents after a slip and fall as the cart rolled away from her outside of  the store. She normally walks with a walker. On exam she appears to have weakness on the right side and worsening weakness on the left side as well. She is experiencing quite a bit of pain in upper extremities bilaterally.  16:45-- Dr Clayborne DanaMesner spoke to neurologist and advised to obtained CT head/cervical spine. Low suspicion for stroke, recommend MRI if CT negative. Patient's pain was managed while in ED. Will reassess.  CT head and cervical spine negative for acute fracture/ injury. Shoulder plain films. Discrepancy in radiology read/impression. Confirmed with Dr. Manson PasseyBrown: bilateral shoulder dislocation. Left:  Inferior dislocation of the left humeral head with respect to the  glenoid fossa. Possible small fracture fragments along the mid to  inferior glenoid fossa.   Right: There is anterior dislocation of the right shoulder. Mechanism is  appropriate for Hill-Sachs deformity. No acute fracture identified.  Right lung apex is clear.   Patient with bilateral shoulder dislocation.  Patient care was  transferred at end of shift to dr. Lynelle DoctorKnapp pending plain films to confirm successful reduction  Anticipate discharge home with treatment for UTI and close follow up with ortho. Rice protocol and pain management.  Final Clinical Impressions(s) / ED Diagnoses   Final diagnoses:  Fall, initial encounter  Acute cystitis without hematuria    New Prescriptions New Prescriptions   No medications on file     Gregary CromerMitchell, Desmund Elman B, PA-C 05/03/17 2048    Mesner, Barbara CowerJason, MD 05/04/17 1500

## 2017-05-03 NOTE — ED Triage Notes (Signed)
Pt here via [facility] for fall.  [facility] [employer] and landed on hands, chin and chest.  Given 100 mcg of fentanyl and 500 ml ns en-route.  Pt appears in great pain.

## 2017-05-03 NOTE — ED Provider Notes (Signed)
.Sedation Date/Time: 05/03/2017 7:45 PM Performed by: Linwood DibblesKNAPP, Eshan Trupiano Authorized by: Linwood DibblesKNAPP, Desiree Daise   Consent:    Consent obtained:  Verbal and written   Consent given by:  Patient   Risks discussed:  Allergic reaction, dysrhythmia, inadequate sedation, nausea and prolonged hypoxia resulting in Lupercio damage   Alternatives discussed:  Analgesia without sedation Indications:    Procedure performed:  Dislocation reduction   Procedure necessitating sedation performed by:  Physician performing sedation   Intended level of sedation:  Deep Pre-sedation assessment:    Time since last food or drink:  6   ASA classification: class 2 - patient with mild systemic disease     Neck mobility: normal     Mouth opening:  3 or more finger widths   Mallampati score:  I - soft palate, uvula, fauces, pillars visible   Pre-sedation assessments completed and reviewed: airway patency, cardiovascular function, hydration status, mental status, nausea/vomiting, pain level, respiratory function and temperature     History of difficult intubation: no     Pre-sedation assessment completed:  05/03/2017 7:51 PM Immediate pre-procedure details:    Reassessment: Patient reassessed immediately prior to procedure     Reviewed: vital signs, relevant labs/tests and NPO status     Verified: bag valve mask available and emergency equipment available   Procedure details (see MAR for exact dosages):    Preoxygenation:  Nasal cannula   Sedation:  Propofol   Intra-procedure monitoring:  Blood pressure monitoring, cardiac monitor, continuous capnometry, continuous pulse oximetry, frequent LOC assessments and frequent vital sign checks   Intra-procedure events: none     Total sedation time (minutes):  25 Post-procedure details:    Post-sedation assessment completed:  05/03/2017 9:30 PM   Attendance: Constant attendance by certified staff until patient recovered     Recovery: Patient returned to pre-procedure baseline     Estimated blood  loss (see I/O flowsheets): yes     Complications:  None   Post-sedation assessments completed and reviewed: airway patency, cardiovascular function, hydration status, mental status, nausea/vomiting, pain level, respiratory function and temperature     Specimens recovered:  None   Patient is stable for discharge or admission: yes     Patient tolerance:  Tolerated well, no immediate complications   Reduction of dislocation Date/Time: 05/03/2017 10:09 PM Performed by: Linwood DibblesKNAPP, Charyl Minervini Authorized by: Linwood DibblesKNAPP, Tuan Tippin  Consent: Verbal consent obtained. Written consent obtained. Risks and benefits: risks, benefits and alternatives were discussed Consent given by: patient Patient understanding: patient states understanding of the procedure being performed Patient identity confirmed: verbally with patient Time out: Immediately prior to procedure a "time out" was called to verify the correct patient, procedure, equipment, support staff and site/side marked as required.  Sedation: Patient sedated: yes Patient tolerance: Patient tolerated the procedure well with no immediate complications Comments: Right shoulder reduction with traction, counter traction.  NV intact after the procedure  Reduction of dislocation Date/Time: 05/03/2017 10:11 PM Performed by: Linwood DibblesKNAPP, Kyrian Stage Authorized by: Linwood DibblesKNAPP, Aubriella Perezgarcia  Consent: Verbal consent obtained. Risks and benefits: risks, benefits and alternatives were discussed Consent given by: patient Patient understanding: patient states understanding of the procedure being performed Imaging studies: imaging studies available Patient tolerance: Patient tolerated the procedure well with no immediate complications Comments: Left  shoulder reduction with traction, counter traction.  NV intact after the procedure   Patient was seen by PA Clovis RileyMitchell and Dr. Erin HearingMessner. She presented to the emergency room with complaints of upper extremity pain and weakness after fall. Her workup reveals  bilateral  shoulder dislocations.  Plan on sedation and reduction.  Procedure listed above.  Pt tolerated her procedures well.  I discussed possible discharge with the patient and her daughter.  Pt needs to use a railing to get into her daughters home.  She has to pull herself up.  Pt will likely dislocate her shoulder again if she tries to pull.  I think she will need short term skilled nursing.  Will consult with medical service for overnight observation, get PT OT and social work involved.    Linwood Dibbles, MD 05/03/17 2223

## 2017-05-03 NOTE — ED Provider Notes (Signed)
Medical screening examination/treatment/procedure(s) were conducted as a shared visit with non-physician practitioner(s) and myself.  I personally evaluated the patient during the encounter.  83 year old female that had what sounds like a relatively mechanical fall today. She usually uses a walker today she was pushing a grocery cart when it seemed to go further and further than she was expecting in her arms were outstretched and she lost her balance she fell straight forward hitting her chest and face on the ground. She comes here with increased bilateral upper extremity weakness and shoulder pain. On exam she has decreased grip strength bilaterally no severe pain when just resting and pain with palpation of shoulders. She does have some neck pain but no point tenderness anywhere. Her lower extremity is likely at her baseline with difficulty with left dorsiflexion. Cranial nerves are intact. Secondary to the bilateral nature of the symptoms right after trauma, code stroke was not activated but we'll do a stroke workup. Also concern for possible central cord syndrome with the fall and primary upper arm weakness so if CT scan is negative will likely need MRI.   EKG Interpretation  Date/Time:  Monday May 03 2017 16:40:08 EDT Ventricular Rate:  89 PR Interval:    QRS Duration: 102 QT Interval:  419 QTC Calculation: 510 R Axis:   -6 Text Interpretation:  Sinus rhythm Low voltage, precordial leads Abnormal R-wave progression, early transition Prolonged QT interval No old tracing to compare Confirmed by [facility] Center[facility] MD, Barbara CowerJASON [phone]) on 05/03/2017 4:59:36 PM         Charnese Federici, Barbara CowerJason, MD 05/04/17 1459

## 2017-05-04 ENCOUNTER — Encounter (HOSPITAL_COMMUNITY): Payer: Self-pay | Admitting: Internal Medicine

## 2017-05-04 DIAGNOSIS — S43004A Unspecified dislocation of right shoulder joint, initial encounter: Secondary | ICD-10-CM | POA: Diagnosis not present

## 2017-05-04 DIAGNOSIS — Y92481 Parking lot as the place of occurrence of the external cause: Secondary | ICD-10-CM | POA: Diagnosis not present

## 2017-05-04 DIAGNOSIS — N390 Urinary tract infection, site not specified: Secondary | ICD-10-CM | POA: Diagnosis present

## 2017-05-04 DIAGNOSIS — J449 Chronic obstructive pulmonary disease, unspecified: Secondary | ICD-10-CM | POA: Diagnosis not present

## 2017-05-04 DIAGNOSIS — Z823 Family history of stroke: Secondary | ICD-10-CM | POA: Diagnosis not present

## 2017-05-04 DIAGNOSIS — Z885 Allergy status to narcotic agent status: Secondary | ICD-10-CM | POA: Diagnosis not present

## 2017-05-04 DIAGNOSIS — R451 Restlessness and agitation: Secondary | ICD-10-CM | POA: Diagnosis not present

## 2017-05-04 DIAGNOSIS — I4581 Long QT syndrome: Secondary | ICD-10-CM | POA: Diagnosis present

## 2017-05-04 DIAGNOSIS — W010XXA Fall on same level from slipping, tripping and stumbling without subsequent striking against object, initial encounter: Secondary | ICD-10-CM | POA: Diagnosis present

## 2017-05-04 DIAGNOSIS — I69354 Hemiplegia and hemiparesis following cerebral infarction affecting left non-dominant side: Secondary | ICD-10-CM | POA: Diagnosis not present

## 2017-05-04 DIAGNOSIS — G629 Polyneuropathy, unspecified: Secondary | ICD-10-CM | POA: Diagnosis present

## 2017-05-04 DIAGNOSIS — S143XXA Injury of brachial plexus, initial encounter: Secondary | ICD-10-CM | POA: Diagnosis present

## 2017-05-04 DIAGNOSIS — E785 Hyperlipidemia, unspecified: Secondary | ICD-10-CM | POA: Diagnosis present

## 2017-05-04 DIAGNOSIS — S43006D Unspecified dislocation of unspecified shoulder joint, subsequent encounter: Secondary | ICD-10-CM | POA: Diagnosis not present

## 2017-05-04 DIAGNOSIS — E876 Hypokalemia: Secondary | ICD-10-CM | POA: Diagnosis present

## 2017-05-04 DIAGNOSIS — G9341 Metabolic encephalopathy: Secondary | ICD-10-CM | POA: Diagnosis present

## 2017-05-04 DIAGNOSIS — N3000 Acute cystitis without hematuria: Secondary | ICD-10-CM | POA: Diagnosis present

## 2017-05-04 DIAGNOSIS — T424X5A Adverse effect of benzodiazepines, initial encounter: Secondary | ICD-10-CM | POA: Diagnosis not present

## 2017-05-04 DIAGNOSIS — I472 Ventricular tachycardia: Secondary | ICD-10-CM | POA: Diagnosis not present

## 2017-05-04 DIAGNOSIS — G5693 Unspecified mononeuropathy of bilateral upper limbs: Secondary | ICD-10-CM | POA: Diagnosis not present

## 2017-05-04 DIAGNOSIS — I471 Supraventricular tachycardia: Secondary | ICD-10-CM | POA: Diagnosis not present

## 2017-05-04 DIAGNOSIS — S43006A Unspecified dislocation of unspecified shoulder joint, initial encounter: Secondary | ICD-10-CM | POA: Diagnosis not present

## 2017-05-04 DIAGNOSIS — Z880 Allergy status to penicillin: Secondary | ICD-10-CM | POA: Diagnosis not present

## 2017-05-04 DIAGNOSIS — R4701 Aphasia: Secondary | ICD-10-CM | POA: Diagnosis not present

## 2017-05-04 DIAGNOSIS — S43014A Anterior dislocation of right humerus, initial encounter: Secondary | ICD-10-CM | POA: Diagnosis present

## 2017-05-04 DIAGNOSIS — W19XXXA Unspecified fall, initial encounter: Secondary | ICD-10-CM | POA: Diagnosis not present

## 2017-05-04 DIAGNOSIS — D72829 Elevated white blood cell count, unspecified: Secondary | ICD-10-CM | POA: Diagnosis not present

## 2017-05-04 DIAGNOSIS — F0390 Unspecified dementia without behavioral disturbance: Secondary | ICD-10-CM | POA: Diagnosis present

## 2017-05-04 DIAGNOSIS — S43035A Inferior dislocation of left humerus, initial encounter: Secondary | ICD-10-CM | POA: Diagnosis present

## 2017-05-04 DIAGNOSIS — K5900 Constipation, unspecified: Secondary | ICD-10-CM | POA: Diagnosis not present

## 2017-05-04 DIAGNOSIS — S43005A Unspecified dislocation of left shoulder joint, initial encounter: Secondary | ICD-10-CM | POA: Diagnosis not present

## 2017-05-04 DIAGNOSIS — R471 Dysarthria and anarthria: Secondary | ICD-10-CM | POA: Diagnosis not present

## 2017-05-04 DIAGNOSIS — F05 Delirium due to known physiological condition: Secondary | ICD-10-CM | POA: Diagnosis not present

## 2017-05-04 DIAGNOSIS — Z66 Do not resuscitate: Secondary | ICD-10-CM | POA: Diagnosis present

## 2017-05-04 DIAGNOSIS — I10 Essential (primary) hypertension: Secondary | ICD-10-CM | POA: Diagnosis present

## 2017-05-04 DIAGNOSIS — M21372 Foot drop, left foot: Secondary | ICD-10-CM | POA: Diagnosis present

## 2017-05-04 LAB — BASIC METABOLIC PANEL
ANION GAP: 11 (ref 5–15)
BUN: 13 mg/dL (ref 6–20)
CALCIUM: 8.7 mg/dL — AB (ref 8.9–10.3)
CO2: 20 mmol/L — ABNORMAL LOW (ref 22–32)
CREATININE: 0.74 mg/dL (ref 0.44–1.00)
Chloride: 107 mmol/L (ref 101–111)
GFR calc non Af Amer: >60 mL/min (ref >60)
Glucose, Bld: 181 mg/dL — ABNORMAL HIGH (ref 65–99)
Potassium: 3.6 mmol/L (ref 3.5–5.1)
SODIUM: 138 mmol/L (ref 135–145)

## 2017-05-04 LAB — CBC
HCT: 40.5 % (ref 36.0–46.0)
HEMOGLOBIN: 13.7 g/dL (ref 12.0–15.0)
MCH: 29.3 pg (ref 26.0–34.0)
MCHC: 33.8 g/dL (ref 30.0–36.0)
MCV: 86.7 fL (ref 78.0–100.0)
Platelets: 323 10*3/uL (ref 150–400)
RBC: 4.67 MIL/uL (ref 3.87–5.11)
RDW: 13.1 % (ref 11.5–15.5)
WBC: 13.7 10*3/uL — AB (ref 4.0–10.5)

## 2017-05-04 LAB — MAGNESIUM: MAGNESIUM: 2.4 mg/dL (ref 1.7–2.4)

## 2017-05-04 MED ORDER — LABETALOL HCL 5 MG/ML IV SOLN
5.0000 mg | Freq: Once | INTRAVENOUS | Status: AC
  Administered 2017-05-04: 5 mg via INTRAVENOUS
  Filled 2017-05-04: qty 4

## 2017-05-04 MED ORDER — GABAPENTIN 600 MG PO TABS: 600.0000 mg | ORAL_TABLET | Freq: Two times a day (BID) | ORAL | Status: DC

## 2017-05-04 MED ORDER — KETOROLAC TROMETHAMINE 15 MG/ML IJ SOLN
15.0000 mg | Freq: Four times a day (QID) | INTRAMUSCULAR | Status: AC
  Administered 2017-05-04 – 2017-05-09 (×20): 15 mg via INTRAVENOUS
  Filled 2017-05-04 (×20): qty 1

## 2017-05-04 MED ORDER — CELECOXIB 200 MG PO CAPS
200.0000 mg | ORAL_CAPSULE | Freq: Every day | ORAL | Status: DC
  Filled 2017-05-04: qty 1

## 2017-05-04 MED ORDER — ROSUVASTATIN CALCIUM 10 MG PO TABS
10.0000 mg | ORAL_TABLET | Freq: Every day | ORAL | Status: DC
  Administered 2017-05-05 – 2017-05-11 (×7): 10 mg via ORAL
  Filled 2017-05-04 (×9): qty 1

## 2017-05-04 MED ORDER — LORAZEPAM 2 MG/ML IJ SOLN
0.5000 mg | Freq: Four times a day (QID) | INTRAMUSCULAR | Status: DC | PRN
  Administered 2017-05-04: 0.5 mg via INTRAVENOUS
  Filled 2017-05-04: qty 1

## 2017-05-04 MED ORDER — ACETAMINOPHEN 500 MG PO TABS
1000.0000 mg | ORAL_TABLET | Freq: Three times a day (TID) | ORAL | Status: AC
  Administered 2017-05-04 – 2017-05-05 (×3): 1000 mg via ORAL
  Filled 2017-05-04 (×4): qty 2

## 2017-05-04 MED ORDER — LORAZEPAM 2 MG/ML IJ SOLN
0.5000 mg | Freq: Three times a day (TID) | INTRAMUSCULAR | Status: DC | PRN
  Administered 2017-05-04 – 2017-05-09 (×7): 0.5 mg via INTRAVENOUS
  Filled 2017-05-04 (×7): qty 1

## 2017-05-04 MED ORDER — CLOPIDOGREL BISULFATE 75 MG PO TABS
75.0000 mg | ORAL_TABLET | ORAL | Status: DC
  Administered 2017-05-05 – 2017-05-10 (×3): 75 mg via ORAL
  Filled 2017-05-04 (×3): qty 1

## 2017-05-04 MED ORDER — IPRATROPIUM-ALBUTEROL 0.5-2.5 (3) MG/3ML IN SOLN: 3.0000 mL | RESPIRATORY_TRACT | Status: DC | PRN

## 2017-05-04 MED ORDER — CYCLOBENZAPRINE HCL 10 MG PO TABS
10.0000 mg | ORAL_TABLET | Freq: Three times a day (TID) | ORAL | Status: DC | PRN
  Administered 2017-05-04: 10 mg via ORAL
  Filled 2017-05-04: qty 1

## 2017-05-04 MED ORDER — MIRABEGRON ER 25 MG PO TB24
50.0000 mg | ORAL_TABLET | Freq: Every day | ORAL | Status: DC
  Administered 2017-05-05 – 2017-05-11 (×7): 50 mg via ORAL
  Filled 2017-05-04 (×8): qty 2

## 2017-05-04 NOTE — Progress Notes (Signed)
Administered 5mg  Labetalol Inj once per order by NP Craige CottaKirby.  Will monitor.

## 2017-05-04 NOTE — Evaluation (Signed)
Occupational Therapy Evaluation Patient Details Name: Derrill Centerlizabeth Mula MRN: [id number] DOB: [birth year] Today's Date: 05/04/2017    History of Present Illness Lanora Manislizabeth was outside [employer] yesterday when she began to fall. She held on to the shopping cart with both hands as she began to fall and dislocated both shoulders. She did not lose consciousness. She was brought to [facility] and her dislocations were reduced by the EDP. She was admitted by the IM service. She lives alone in [place], [place] and was here visiting her daughter. Pt is currently delirious (possibly from UTI) and unable to contribute to history which was gleaned from chart and provided by daughter. PMH: COPD, left foot drop, TIA, afib, peripheral neuropathy and previous back and hip surgery and TKA right.    Clinical Impression   Pt admitted with above. She demonstrates the below listed deficits and will benefit from continued OT to maximize safety and independence with BADLs.  Pt follows commands inconsistently. She requires total A for ADLs;  Max A to adhere to precautions - attempts to move UEs out of slings.   She requires min A, progressing to max A to sit EOB, with lean to Rt as she fatigues.  Mod A +2 to stand.  No spontaneous active extension of Rt wrist, or fingers noted, but pt unable to follow commands for formal assessment.   Recommend SNF.       Follow Up Recommendations  SNF;Supervision/Assistance - 24 hour    Equipment Recommendations  3 in 1 bedside commode;Tub/shower seat;Hospital bed    Recommendations for Other Services       Precautions / Restrictions Precautions Precautions: Fall Required Braces or Orthoses: Sling (bil shoulders) Restrictions Weight Bearing Restrictions: Yes RUE Weight Bearing: Non weight bearing LUE Weight Bearing: Non weight bearing Other Position/Activity Restrictions: NWB and slings at all times x 1 wk.  Gentle pendulums       Mobility Bed Mobility Overal bed mobility:  Needs Assistance Bed Mobility: Supine to Sit;Sit to Supine     Supine to sit: Total assist Sit to supine: Total assist   General bed mobility comments: Needs assist with all aspects  Transfers Overall transfer level: Needs assistance   Transfers: Sit to/from Stand Sit to Stand: Mod assist;+2 safety/equipment         General transfer comment: Pt able to stand x 1 min with mod A +2.   She fatigues and is unstable     Balance Overall balance assessment: Needs assistance;History of Falls Sitting-balance support: No upper extremity supported;Feet supported Sitting balance-Leahy Scale: Poor Sitting balance - Comments: sits EOB with min A, progressing to max A.  Leans to Rt as she fatigues.     Standing balance support: No upper extremity supported Standing balance-Leahy Scale: Poor Standing balance comment: requires mod A +2                           ADL either performed or assessed with clinical judgement   ADL Overall ADL's : Needs assistance/impaired Eating/Feeding: Total assistance   Grooming: Total assistance   Upper Body Bathing: Total assistance   Lower Body Bathing: Total assistance   Upper Body Dressing : Total assistance   Lower Body Dressing: Total assistance   Toilet Transfer: Moderate assistance;+2 for physical assistance   Toileting- Clothing Manipulation and Hygiene: Total assistance       Functional mobility during ADLs: Maximal assistance General ADL Comments: Pt unable to use bil UEs for ADLs  Vision Patient Visual Report: No change from baseline       Perception     Praxis      Pertinent Vitals/Pain Pain Assessment: Faces Faces Pain Scale: Hurts a little bit Pain Location: bil shoulders Pain Descriptors / Indicators: Grimacing Pain Intervention(s): Monitored during session     Hand Dominance Left   Extremity/Trunk Assessment Upper Extremity Assessment Upper Extremity Assessment: RUE deficits/detail;LUE  deficits/detail RUE Deficits / Details: pt attempting to spontaneously move bil. UEs in the slings.   She maintains wrist in flexion and MCPs flexed.  She did demonstrate full ROM of thumb.  She is unable to follow commands consistently to assess hands, wrists, forearms, and is unable to participate in sensory testing  RUE Coordination: decreased fine motor;decreased gross motor LUE Deficits / Details: Pt spontaneously attempting to move bil. UEs, unable to follow commands consistently to accurately assess    Lower Extremity Assessment Lower Extremity Assessment: Defer to PT evaluation   Cervical / Trunk Assessment Cervical / Trunk Assessment: Kyphotic   Communication Communication Communication: No difficulties   Cognition Arousal/Alertness: Awake/alert;Lethargic;Suspect due to medications Behavior During Therapy: Flat affect;Impulsive;Restless Overall Cognitive Status: Impaired/Different from baseline Area of Impairment: Memory;Following commands;Safety/judgement;Awareness;Problem solving;Orientation                 Orientation Level: Disoriented to;Place;Time   Memory: Decreased recall of precautions;Decreased short-term memory Following Commands: Follows one step commands inconsistently;Follows one step commands with increased time Safety/Judgement: Decreased awareness of safety;Decreased awareness of deficits Awareness: Intellectual Problem Solving: Slow processing;Decreased initiation;Difficulty sequencing;Requires verbal cues;Requires tactile cues     General Comments  daughter present     Exercises Exercises: General Lower Extremity General Exercises - Lower Extremity Long Arc Quad: AROM;Both;5 reps;Seated   Shoulder Instructions      Home Living Family/patient expects to be discharged to:: Private residence Living Arrangements: Alone Available Help at Discharge: Family;Available PRN/intermittently (was visiting her daughter when she fell) Type of Home:  House Home Access: Stairs to enter [employer] of Steps: 4 Entrance Stairs-Rails: Right;Left Home Layout: One level     Bathroom Shower/Tub: Producer, television/film/video: Standard Bathroom Accessibility: Yes   Home Equipment: Environmental consultant - 2 wheels;Walker - 4 wheels;Cane - single point;Tub bench   Additional Comments: Pt lives in [place], [place], and was visiting daughter for the week       Prior Functioning/Environment Level of Independence: Independent with assistive device(s)        Comments: used RW most of time per daughter.  h/o falls         OT Problem List: Decreased strength;Decreased range of motion;Decreased activity tolerance;Impaired balance (sitting and/or standing);Decreased coordination;Decreased cognition;Decreased safety awareness;Decreased knowledge of use of DME or AE;Decreased knowledge of precautions;Impaired UE functional use;Pain      OT Treatment/Interventions: Self-care/ADL training;Therapeutic exercise;DME and/or AE instruction;Therapeutic activities;Cognitive remediation/compensation;Patient/family education;Balance training    OT Goals(Current goals can be found in the care plan section) Acute Rehab OT Goals Patient Stated Goal: go to rehab  OT Goal Formulation: With patient/family Time For Goal Achievement: 05/11/17 Potential to Achieve Goals: Good ADL Goals Pt Will Transfer to Toilet: with min assist;stand pivot transfer;bedside commode Additional ADL Goal #1: Pt will be independent with precautions  Additional ADL Goal #2: Pt will be perform HEP with mod A  from family/staff - pendulums, wrist, hand ROM   OT Frequency: Min 2X/week   Barriers to D/C: Decreased caregiver support          Co-evaluation  AM-PAC PT "6 Clicks" Daily Activity     Outcome Measure Help from another person eating meals?: Total Help from another person taking care of personal grooming?: Total Help from another person toileting,  which includes using toliet, bedpan, or urinal?: Total Help from another person bathing (including washing, rinsing, drying)?: Total Help from another person to put on and taking off regular upper body clothing?: Total Help from another person to put on and taking off regular lower body clothing?: Total 6 Click Score: 6   End of Session Equipment Utilized During Treatment: Gait belt;Other (comment) (bil slings ) Nurse Communication: Mobility status  Activity Tolerance: Patient limited by fatigue Patient left: in bed;with call bell/phone within reach;with bed alarm set;with family/visitor present  OT Visit Diagnosis: Unsteadiness on feet (R26.81);Other symptoms and signs involving cognitive function                Time: 8295-6213 OT Time Calculation (min): 25 min Charges:  OT General Charges $OT Visit: 1 Procedure OT Evaluation $OT Eval Moderate Complexity: 1 Procedure OT Treatments $Therapeutic Activity: 8-22 mins G-Codes:     [employer], OTR/L [phone]   Jeani Hawking M 05/04/2017, 4:59 PM

## 2017-05-04 NOTE — Progress Notes (Signed)
Floor coverage [facility] NP made aware thru page for patient's telemetry showed multiform PVCs, BP 163/119, PR 119, RR 18, T 98.4 and O2Sat=98% at RA.  Informed [facility] floor coverage that patient has PRN Labetalol for SBP>170.  Awaiting for reply.

## 2017-05-04 NOTE — Progress Notes (Signed)
PT note Eval completed and to be placed in chart.  Pt will most likely need SNF as daughter cannot lift on pt and pt always used UES.  MD assume that pt cannot use Bil UEs and has to have slings at all times.  Please clarify.  Will follow acutely.  [facility] Lister Brizzi,PT Acute Rehabilitation [phone] (pager)

## 2017-05-04 NOTE — Care Management Obs Status (Signed)
MEDICARE OBSERVATION STATUS NOTIFICATION   Patient Details  Name: Kristin Conrad MRN: [id number] Date of Birth: [birth year]   Medicare Observation Status Notification Given:  Yes Signed by daughter at bedside; patient asleep.     Lawerance Sabalebbie Michael Walrath, RN 05/04/2017, 10:19 AM

## 2017-05-04 NOTE — Evaluation (Signed)
Physical Therapy Evaluation Patient Details Name: Kristin Conrad MRN: [id number] DOB: Nov 06, 1930 Today's Date: 05/04/2017   History of Present Illness  Kristin Conrad was outside [employer] yesterday when she began to fall. She held on to the shopping cart with both hands as she began to fall and dislocated both shoulders. She did not lose consciousness. She was [facility] and her dislocations were reduced by the EDP. She was admitted by the IM service. She lives alone in [place], [place] and was here visiting her daughter. Pt is currently delirious (possibly from UTI) and unable to contribute to history which was gleaned from chart and provided by daughter. PMH: COPD, left foot drop, TIA, afib, peripheral neuropathy and previous back and hip surgery and TKA right.   Clinical Impression  Pt admitted with above diagnosis. Pt currently with functional limitations due to the deficits listed below (see PT Problem List). Pt was able to sit EOB for 5 minutes but was so lethargic could not progress pt.  NEed to clarify how long pt will need to use slings as daughter does not feel that she can care for pt. Per daughter pt used UES for getting up and down steps as she pulled on rails and she used them to stand significantly. Feel SNF will be needed. Will follow acutely.  Pt will benefit from skilled PT to increase their independence and safety with mobility to allow discharge to the venue listed below.      Follow Up Recommendations SNF;Supervision/Assistance - 24 hour    Equipment Recommendations  None recommended by PT    Recommendations for Other Services       Precautions / Restrictions Precautions Precautions: Fall Required Braces or Orthoses: Sling (bil shoulders) Restrictions Weight Bearing Restrictions: Yes RUE Weight Bearing: Non weight bearing LUE Weight Bearing: Non weight bearing Other Position/Activity Restrictions: Assume NWB bil UEs until clarified by MD.       Mobility  Bed  Mobility Overal bed mobility: Needs Assistance Bed Mobility: Supine to Sit     Supine to sit: Total assist;+2 for physical assistance     General bed mobility comments: Pt needed total assist to come to EOB with use of pad.  Pt lethargic and had to constantly awaken pt.    Transfers                 General transfer comment: unable due to pt never awake enough. Kept dozing off.   Ambulation/Gait                Stairs            Wheelchair Mobility    Modified Rankin (Stroke Patients Only)       Balance Overall balance assessment: Needs assistance;History of Falls Sitting-balance support: No upper extremity supported;Feet supported Sitting balance-Leahy Scale: Zero Sitting balance - Comments: Needed max to total assist partially because she kept falling asleep and due to she can't use UEs.  Slings in place.  Pt did get her balance for brief periods but could not sustain.  Sat a total of 5 minutes.                                      Pertinent Vitals/Pain Pain Assessment: Faces Faces Pain Scale: Hurts whole lot Pain Location: bil shoulders Pain Descriptors / Indicators: Aching;Guarding;Grimacing;Operative site guarding Pain Intervention(s): Limited activity within patient's tolerance;Monitored during session;Premedicated before session;Repositioned  Home Living Family/patient expects to be discharged to:: Private residence Living Arrangements: Alone Available Help at Discharge: Family;Available PRN/intermittently (was visiting her daughter when she fell) Type of Home: House Home Access: Stairs to enter Entrance Stairs-Rails: Doctor, general practice of Steps: 4 Home Layout: One level Home Equipment: Walker - 2 wheels;Walker - 4 wheels;Cane - single point;Tub bench Additional Comments: daughter thinks she has a tub bench    Prior Function Level of Independence: Independent with assistive device(s)         Comments:  used RW most of time per daughter     Hand Dominance        Extremity/Trunk Assessment   Upper Extremity Assessment Upper Extremity Assessment: Defer to OT evaluation    Lower Extremity Assessment Lower Extremity Assessment: Generalized weakness    Cervical / Trunk Assessment Cervical / Trunk Assessment: Normal  Communication   Communication: No difficulties  Cognition Arousal/Alertness: Lethargic;Suspect due to medications (had pain meds and Ativan) Behavior During Therapy: Flat affect Overall Cognitive Status: Impaired/Different from baseline Area of Impairment: Orientation;Memory;Following commands;Safety/judgement;Awareness;Problem solving                 Orientation Level: Disoriented to;Place;Time;Situation   Memory: Decreased recall of precautions Following Commands: Follows one step commands inconsistently;Follows one step commands with increased time Safety/Judgement: Decreased awareness of safety;Decreased awareness of deficits Awareness: Intellectual Problem Solving: Slow processing;Decreased initiation;Difficulty sequencing;Requires verbal cues;Requires tactile cues        General Comments      Exercises General Exercises - Lower Extremity Long Arc Quad: AROM;Both;5 reps;Seated   Assessment/Plan    PT Assessment Patient needs continued PT services  PT Problem List Decreased strength;Decreased activity tolerance;Decreased balance;Decreased mobility;Decreased knowledge of use of DME;Decreased cognition;Decreased safety awareness;Decreased knowledge of precautions;Pain       PT Treatment Interventions DME instruction;Gait training;Stair training;Therapeutic activities;Functional mobility training;Therapeutic exercise;Balance training;Patient/family education    PT Goals (Current goals can be found in the Care Plan section)  Acute Rehab PT Goals Patient Stated Goal: to get better PT Goal Formulation: With patient/family Time For Goal Achievement:  05/18/17 Potential to Achieve Goals: Good    Frequency Min 5X/week   Barriers to discharge Decreased caregiver support daughter cannot lift pt and currently pt needs total assist    Co-evaluation               AM-PAC PT "6 Clicks" Daily Activity  Outcome Measure Difficulty turning over in bed (including adjusting bedclothes, sheets and blankets)?: Total Difficulty moving from lying on back to sitting on the side of the bed? : Total Difficulty sitting down on and standing up from a chair with arms (e.g., wheelchair, bedside commode, etc,.)?: Total Help needed moving to and from a bed to chair (including a wheelchair)?: Total Help needed walking in hospital room?: Total Help needed climbing 3-5 steps with a railing? : Total 6 Click Score: 6    End of Session Equipment Utilized During Treatment: Gait belt Activity Tolerance: Patient limited by fatigue;Patient limited by pain;Patient limited by lethargy Patient left: in bed;with call bell/phone within reach;with bed alarm set;with family/visitor present Nurse Communication: Mobility status;Need for lift equipment PT Visit Diagnosis: Unsteadiness on feet (R26.81);Muscle weakness (generalized) (M62.81);Pain Pain - Right/Left:  (bil) Pain - part of body: Shoulder    Time: 0454-0981 PT Time Calculation (min) (ACUTE ONLY): 24 min   Charges:   PT Evaluation $PT Eval Moderate Complexity: 1 Procedure PT Treatments $Therapeutic Activity: 8-22 mins   PT G Codes:  PT G-Codes **NOT FOR INPATIENT CLASS** Functional Assessment Tool Used: AM-PAC 6 Clicks Basic Mobility Functional Limitation: Changing and maintaining body position Changing and Maintaining Body Position Current Status (K4401): At least 80 percent but less than 100 percent impaired, limited or restricted Changing and Maintaining Body Position Goal Status (U2725): At least 20 percent but less than 40 percent impaired, limited or restricted    [facility] Acute  Rehabilitation [phone] (pager)   Berline Lopes 05/04/2017, 3:38 PM

## 2017-05-04 NOTE — Consult Note (Signed)
Reason for Consult:Bilateral shoulder dislocations Referring Physician: Tracina Beaumont Conrad is an 83 y.o. female.  HPI: Kristin Conrad was outside [employer] yesterday when she began to fall. She held on to the shopping cart with both hands as she began to fall and dislocated both shoulders. She did not lose consciousness. She was brought to [facility] and her dislocations were reduced by the EDP. She was admitted by the IM service. She lives alone in [place], [place] and was here visiting her daughter. Pt is currently delirious (possibly from UTI) and unable to contribute to history which was gleaned from chart and provided by daughter.  Past Medical History:  Diagnosis Date  . COPD (chronic obstructive pulmonary disease) ([place])   . Left foot drop   . 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 Peripheral neuropathy   . TIA (transient ischemic attack)     Past Surgical History:  Procedure Laterality Date  . BACK SURGERY    . HIP SURGERY    . JOINT REPLACEMENT      Family History  Problem Relation Age of Onset  . Stroke Father     Social History:  reports that she has never smoked. She has never used smokeless tobacco. She reports that she does not drink alcohol or use drugs.  Allergies:  Allergies  Allergen Reactions  . Codeine   . Penicillins Other (See Comments)    childhood  . Sulfa Antibiotics Other (See Comments)    Childhood     Medications: I have reviewed the patient's current medications.  Results for orders placed or performed during the hospital encounter of 05/03/17 (from the past 48 hour(s))  CBC     Status: None   Collection Time: 05/03/17  4:57 PM  Result Value Ref Range   WBC 8.2 4.0 - 10.5 K/uL   RBC 4.22 3.87 - 5.11 MIL/uL   Hemoglobin 12.3 12.0 - 15.0 g/dL   HCT 36.7 36.0 - 46.0 %   MCV 87.0 78.0 - 100.0 fL   MCH 29.1 26.0 - 34.0 pg   MCHC 33.5 30.0 - 36.0 g/dL   RDW 13.0 11.5 - 15.5 %   Platelets 264 150 - 400 K/uL  Comprehensive metabolic panel     Status:  Abnormal   Collection Time: 05/03/17  4:57 PM  Result Value Ref Range   Sodium 139 135 - 145 mmol/L   Potassium 3.4 (L) 3.5 - 5.1 mmol/L   Chloride 108 101 - 111 mmol/L   CO2 21 (L) 22 - 32 mmol/L   Glucose, Bld 195 (H) 65 - 99 mg/dL   BUN 16 6 - 20 mg/dL   Creatinine, Ser 0.96 0.44 - 1.00 mg/dL   Calcium 8.6 (L) 8.9 - 10.3 mg/dL   Total Protein 5.7 (L) 6.5 - 8.1 g/dL   Albumin 3.4 (L) 3.5 - 5.0 g/dL   AST 22 15 - 41 U/L   ALT 14 14 - 54 U/L   Alkaline Phosphatase 75 38 - 126 U/L   Total Bilirubin 0.6 0.3 - 1.2 mg/dL   GFR calc non Af Amer 53 (L) >60 mL/min   GFR calc Af Amer >60 >60 mL/min    Comment: (NOTE) The eGFR has been calculated using the CKD EPI equation. This calculation has not been validated in all clinical situations. eGFR's persistently <60 mL/min signify possible Chronic Kidney Disease.    Anion gap 10 5 - 15  Ethanol     Status: None   Collection Time: 05/03/17  4:57 PM  Result Value Ref Range   Alcohol, Ethyl (B) <5 <5 mg/dL    Comment:        LOWEST DETECTABLE LIMIT FOR SERUM ALCOHOL IS 5 mg/dL FOR MEDICAL PURPOSES ONLY   Protime-INR     Status: None   Collection Time: 05/03/17  4:57 PM  Result Value Ref Range   Prothrombin Time 13.3 11.4 - 15.2 seconds   INR 1.01   APTT     Status: None   Collection Time: 05/03/17  4:57 PM  Result Value Ref Range   aPTT 28 24 - 36 seconds  Differential     Status: None   Collection Time: 05/03/17  4:57 PM  Result Value Ref Range   Neutrophils Relative % 76 %   Neutro Abs 6.2 1.7 - 7.7 K/uL   Lymphocytes Relative 17 %   Lymphs Abs 1.3 0.7 - 4.0 K/uL   Monocytes Relative 4 %   Monocytes Absolute 0.3 0.1 - 1.0 K/uL   Eosinophils Relative 3 %   Eosinophils Absolute 0.2 0.0 - 0.7 K/uL   Basophils Relative 0 %   Basophils Absolute 0.0 0.0 - 0.1 K/uL  I-stat troponin, ED (not at [facility], [facility])     Status: None   Collection Time: 05/03/17  5:07 PM  Result Value Ref Range   Troponin i, poc 0.01 0.00 - 0.08 ng/mL    Comment 3            Comment: Due to the release kinetics of cTnI, a negative result within the first hours of the onset of symptoms does not rule out myocardial infarction with certainty. If myocardial infarction is still suspected, repeat the test at appropriate intervals.   I-Stat Chem 8, ED  (not at [facility], [facility])     Status: Abnormal   Collection Time: 05/03/17  5:09 PM  Result Value Ref Range   Sodium 142 135 - 145 mmol/L   Potassium 3.3 (L) 3.5 - 5.1 mmol/L   Chloride 105 101 - 111 mmol/L   BUN 19 6 - 20 mg/dL   Creatinine, Ser 0.80 0.44 - 1.00 mg/dL   Glucose, Bld 198 (H) 65 - 99 mg/dL   Calcium, Ion 1.17 1.15 - 1.40 mmol/L   TCO2 20 0 - 100 mmol/L   Hemoglobin 12.2 12.0 - 15.0 g/dL   HCT 36.0 36.0 - 46.0 %  Urine rapid drug screen (hosp performed)not at [facility]     Status: None   Collection Time: 05/03/17  6:20 PM  Result Value Ref Range   Opiates NONE DETECTED NONE DETECTED   Cocaine NONE DETECTED NONE DETECTED   Benzodiazepines NONE DETECTED NONE DETECTED   Amphetamines NONE DETECTED NONE DETECTED   Tetrahydrocannabinol NONE DETECTED NONE DETECTED   Barbiturates NONE DETECTED NONE DETECTED    Comment:        DRUG SCREEN FOR MEDICAL PURPOSES ONLY.  IF CONFIRMATION IS NEEDED FOR ANY PURPOSE, NOTIFY LAB WITHIN 5 DAYS.        LOWEST DETECTABLE LIMITS FOR URINE DRUG SCREEN Drug Class       Cutoff (ng/mL) Amphetamine      1000 Barbiturate      200 Benzodiazepine   275 Tricyclics       170 Opiates          300 Cocaine          300 THC              50   Urinalysis, Routine  w reflex microscopic     Status: Abnormal   Collection Time: 05/03/17  6:20 PM  Result Value Ref Range   Color, Urine YELLOW YELLOW   APPearance HAZY (A) CLEAR   Specific Gravity, Urine 1.014 1.005 - 1.030   pH 6.0 5.0 - 8.0   Glucose, UA 150 (A) NEGATIVE mg/dL   Hgb urine dipstick NEGATIVE NEGATIVE   Bilirubin Urine NEGATIVE NEGATIVE   Ketones, ur 20 (A) NEGATIVE mg/dL   Protein, ur NEGATIVE  NEGATIVE mg/dL   Nitrite POSITIVE (A) NEGATIVE   Leukocytes, UA NEGATIVE NEGATIVE   RBC / HPF NONE SEEN 0 - 5 RBC/hpf   WBC, UA 0-5 0 - 5 WBC/hpf   Bacteria, UA MANY (A) NONE SEEN   Squamous Epithelial / LPF NONE SEEN NONE SEEN   Mucous PRESENT   CBC     Status: Abnormal   Collection Time: 05/04/17  3:42 AM  Result Value Ref Range   WBC 13.7 (H) 4.0 - 10.5 K/uL   RBC 4.67 3.87 - 5.11 MIL/uL   Hemoglobin 13.7 12.0 - 15.0 g/dL   HCT 40.5 36.0 - 46.0 %   MCV 86.7 78.0 - 100.0 fL   MCH 29.3 26.0 - 34.0 pg   MCHC 33.8 30.0 - 36.0 g/dL   RDW 13.1 11.5 - 15.5 %   Platelets 323 150 - 400 K/uL  Basic metabolic panel     Status: Abnormal   Collection Time: 05/04/17  3:42 AM  Result Value Ref Range   Sodium 138 135 - 145 mmol/L   Potassium 3.6 3.5 - 5.1 mmol/L   Chloride 107 101 - 111 mmol/L   CO2 20 (L) 22 - 32 mmol/L   Glucose, Bld 181 (H) 65 - 99 mg/dL   BUN 13 6 - 20 mg/dL   Creatinine, Ser 0.74 0.44 - 1.00 mg/dL   Calcium 8.7 (L) 8.9 - 10.3 mg/dL   GFR calc non Af Amer >60 >60 mL/min   GFR calc Af Amer >60 >60 mL/min    Comment: (NOTE) The eGFR has been calculated using the CKD EPI equation. This calculation has not been validated in all clinical situations. eGFR's persistently <60 mL/min signify possible Chronic Kidney Disease.    Anion gap 11 5 - 15  Magnesium     Status: None   Collection Time: 05/04/17  3:42 AM  Result Value Ref Range   Magnesium 2.4 1.7 - 2.4 mg/dL    Dg Shoulder Right  Result Date: 05/03/2017 CLINICAL DATA:  Dislocation, postreduction. EXAM: RIGHT SHOULDER - 2+ VIEW COMPARISON:  Pre reduction exam earlier this day. FINDINGS: Previous dislocation of the right shoulder is been reduced, alignment appears anatomic. No large Hill-Sachs impaction fracture. Acromioclavicular joint is congruent. IMPRESSION: Reduction of prior anterior shoulder dislocation. No fracture or Hill-Sachs impaction injury is visualized. Electronically Signed   By: Jeb Levering  M.D.   On: 05/03/2017 22:14   Dg Shoulder Right  Addendum Date: 05/03/2017   ADDENDUM REPORT: 05/03/2017 19:54 ADDENDUM: Corrected report: IMPRESSION: Dislocation of the right shoulder. Electronically Signed   By: Nolon Nations M.D.   On: 05/03/2017 19:54   Result Date: 05/03/2017 CLINICAL DATA:  Pain both shoulders s/p fall. Unable to get y views or transthoracic. Due to pt.'s condition could only get ap images EXAM: RIGHT SHOULDER - 2+ VIEW COMPARISON:  None. FINDINGS: There is anterior dislocation of the right shoulder. Mechanism is appropriate for Hill-Sachs deformity. No acute fracture identified. Right lung apex is clear.  IMPRESSION: Negative. Electronically Signed: By: Nolon Nations M.D. On: 05/03/2017 19:33   Ct Head Wo Contrast  Result Date: 05/03/2017 CLINICAL DATA:  Pt fell today , Tripped at [employer] and landed on hands, chin and chest. EXAM: CT HEAD WITHOUT CONTRAST CT CERVICAL SPINE WITHOUT CONTRAST TECHNIQUE: Multidetector CT imaging of the head and cervical spine was performed following the standard protocol without intravenous contrast. Multiplanar CT image reconstructions of the cervical spine were also generated. COMPARISON:  None. FINDINGS: CT HEAD FINDINGS Brain: No evidence of acute infarction, hemorrhage, hydrocephalus, extra-axial collection or mass lesion/mass effect. The ventricles are normal configuration. There is ventricular and sulcal enlargement reflecting mild diffuse atrophy. Patchy white matter hypoattenuation is also noted consistent with moderate chronic microvascular ischemic change. Vascular: No hyperdense vessel or unexpected calcification. Skull: Normal. Negative for fracture or focal lesion. Sinuses/Orbits: Globes and orbits are unremarkable. Visualized sinuses and mastoid air cells are clear. Other: None. CT CERVICAL SPINE FINDINGS Alignment: There is a grade 1 anterolisthesis of C5 on C6 and of C6 on C7. No other spondylolisthesis. Skull base and vertebrae: No  acute fracture. No primary bone lesion or focal pathologic process. Soft tissues and spinal canal: No prevertebral fluid or swelling. No visible canal hematoma. Disc levels: Bilateral laminectomies have been performed from C3 through C6. There is mild loss of disc height at C5-C6 with moderate to marked loss of disc height at C6-C7. Mild to moderate loss disc height at C7-T1. There are facet degenerative changes bilaterally throughout cervical spine neural foraminal narrowing is noted at multiple levels greatest bilaterally at C6-C7, severe. There is no convincing disc herniation. Upper chest: No acute findings. There are nodule arising from the left thyroid lobe, largest measuring 2.3 cm. Right lobe is not visualized and may have been surgically removed. Other: None IMPRESSION: HEAD CT:  No acute intracranial abnormalities.  No skull fracture. CERVICAL CT: No fracture or acute finding. Degenerative and postsurgical changes as detailed. Electronically Signed   By: Lajean Manes M.D.   On: 05/03/2017 18:37   Ct Cervical Spine Wo Contrast  Result Date: 05/03/2017 CLINICAL DATA:  Pt fell today , Tripped at [employer] and landed on hands, chin and chest. EXAM: CT HEAD WITHOUT CONTRAST CT CERVICAL SPINE WITHOUT CONTRAST TECHNIQUE: Multidetector CT imaging of the head and cervical spine was performed following the standard protocol without intravenous contrast. Multiplanar CT image reconstructions of the cervical spine were also generated. COMPARISON:  None. FINDINGS: CT HEAD FINDINGS Brain: No evidence of acute infarction, hemorrhage, hydrocephalus, extra-axial collection or mass lesion/mass effect. The ventricles are normal configuration. There is ventricular and sulcal enlargement reflecting mild diffuse atrophy. Patchy white matter hypoattenuation is also noted consistent with moderate chronic microvascular ischemic change. Vascular: No hyperdense vessel or unexpected calcification. Skull: Normal. Negative for  fracture or focal lesion. Sinuses/Orbits: Globes and orbits are unremarkable. Visualized sinuses and mastoid air cells are clear. Other: None. CT CERVICAL SPINE FINDINGS Alignment: There is a grade 1 anterolisthesis of C5 on C6 and of C6 on C7. No other spondylolisthesis. Skull base and vertebrae: No acute fracture. No primary bone lesion or focal pathologic process. Soft tissues and spinal canal: No prevertebral fluid or swelling. No visible canal hematoma. Disc levels: Bilateral laminectomies have been performed from C3 through C6. There is mild loss of disc height at C5-C6 with moderate to marked loss of disc height at C6-C7. Mild to moderate loss disc height at C7-T1. There are facet degenerative changes bilaterally throughout cervical spine neural foraminal  narrowing is noted at multiple levels greatest bilaterally at C6-C7, severe. There is no convincing disc herniation. Upper chest: No acute findings. There are nodule arising from the left thyroid lobe, largest measuring 2.3 cm. Right lobe is not visualized and may have been surgically removed. Other: None IMPRESSION: HEAD CT:  No acute intracranial abnormalities.  No skull fracture. CERVICAL CT: No fracture or acute finding. Degenerative and postsurgical changes as detailed. Electronically Signed   By: Lajean Manes M.D.   On: 05/03/2017 18:37   Dg Shoulder Left  Result Date: 05/03/2017 CLINICAL DATA:  Dislocation, postreduction. EXAM: LEFT SHOULDER - 2+ VIEW COMPARISON:  Pre reduction radiographs earlier this day. FINDINGS: Shoulder dislocation has been reduced, alignment is now anatomic. The previous questioned glenoid fracture on prior exam is not visualized, may be obscured by osseous overlap. Acromioclavicular joint is congruent. IMPRESSION: Anatomic alignment postreduction. The previous questioned glenoid fracture is not visualized, may be obscured by osseous overlap. Electronically Signed   By: Jeb Levering M.D.   On: 05/03/2017 22:16   Dg  Shoulder Left  Result Date: 05/03/2017 CLINICAL DATA:  Fall, pain EXAM: LEFT SHOULDER - 2+ VIEW COMPARISON:  None. FINDINGS: AC joint appears intact.  The left lung apex is clear. Inferior dislocation of the left humeral head with respect to the glenoid fossa. Possible cortical fracture along the mid to inferior glenoid rim. IMPRESSION: Inferior dislocation of the left humeral head with respect to the glenoid fossa. Possible small fracture fragments along the mid to inferior glenoid fossa. Electronically Signed   By: Donavan Foil M.D.   On: 05/03/2017 19:34    Review of Systems  Unable to perform ROS: Mental status change   Blood pressure (!) 162/62, pulse 91, temperature 98.2 F (36.8 C), temperature source Oral, resp. rate 19, height '5\' 3"'  (1.6 m), weight 71.7 kg (158 lb), SpO2 96 %. Physical Exam  Constitutional: She appears well-developed and well-nourished. She appears lethargic. No distress.  HENT:  Head: Normocephalic.  Eyes: Right eye exhibits no discharge. Left eye exhibits no discharge.  Cardiovascular: Normal rate and regular rhythm.   Respiratory: Effort normal. No respiratory distress.  Musculoskeletal:  Right shoulder, elbow, wrist, digits- no skin wounds, minimally TTP shoulder, no instability, no blocks to motion  Sens  Ax/R/M/U unable to assess  Mot   Ax/ R/ PIN/ M/ AIN/ U unable to assess  Rad 2+  Left shoulder, elbow, wrist, digits- no skin wounds, moderate TTP shoulder, no instability, no blocks to motion  Sens  Ax/R/M/U unable to assess  Mot   Ax/ R/ PIN/ M/ AIN/ U unable to assess  Rad 2+   Neurological: She appears lethargic.  Skin: Skin is warm and dry. She is not diaphoretic.    Assessment/Plan: Bilateral shoulder dislocations, possible glenoid fx left -- Pt will need to be NWB and in slings for 1 week. During this time she should perform gentle pendulums in the sling once her sensorium clears. She should follow up with orthopedic surgery after a week at  which point she will likely start a physical therapy program.     Lisette Abu, PA-C Orthopedic Surgery [phone] 05/04/2017, 1:49 PM

## 2017-05-04 NOTE — Progress Notes (Signed)
Received patient from ED accompanied by daughter.  Patient AOx3, VS stable with ST pulse rate, placed on telemetry per order, pain at 4/10 but refused PRN pain medication.  Patient oriented to room, bed controls and call light.  Patient now resting on bed comfortably with room lights off.  Will continue to monitor.

## 2017-05-04 NOTE — Progress Notes (Signed)
[facility]                                                                              Patient Demographics  Kristin Conrad, is a 83 y.o. female, DOB - 06/29/1930, FAO:[id number]  Admit date - 05/03/2017   Admitting Physician Michael Litter, MD  Outpatient Primary MD for the patient is System, Pcp Not In  Outpatient specialists:   LOS - 0  days    Chief Complaint  Patient presents with  . Fall       Brief summary   Kristin Conrad is a 83 y.o. woman with a history of TIA, COPD, peripheral neuropathy, and left foot drop who was in her baseline state of health until she had a mechanical fall outside of [employer] On the day of admission witnessed by her daughter. The patient's daughter reported that patient has chronic bilateral lower extremity weakness, there was no loss of consciousness. She immediately had bilateral shoulder pain. X-ray showed bilateral shoulder dislocations. UA was positive for UTI. Both shoulders were reduced in the ED with conscious sedation. Patient was also found to have acute encephalopathy likely due to UTI. She was admitted for further workup.   Assessment & Plan    Principal Problem:   Mechanical fall with bilateral shoulder dislocation status post reduction with conscious sedation - PTOT evaluation - Orthopedics consulted, Dr Aundria Rud to see  - Patient will likely need slings at all times and possibly need skilled nursing facility - Currently very confused, DC oxycodone, Neurontin, Flexeril - Placed on scheduled Toradol and Tylenol  Active Problems:   Hypokalemia - Resolved    COPD (chronic obstructive pulmonary disease) (HCC) - Currently no wheezing, continue duo nebs as needed    Acute lower UTI -Continue IV Rocephin, follow urine culture and sensitivities     Acute metabolic encephalopathy Possibly due to UTI and medication effect from narcotics - DC narcotics, continue IV Rocephin  Hyperlipidemia -Continue statin   Code  Status:DO NOT RESUSCITATE  DVT Prophylaxis:   SCD's Family Communication: Discussed in detail with the patient, all imaging results, lab results explained to the patient. No family member at the bedside    Disposition Plan:  Time Spent in minutes   25 minutes  Procedures:  Bilateral shoulder reduction under conscious sedation  Consultants:   Orthopedics called today  Antimicrobials:   IV Rocephin 5/14>   Medications  Scheduled Meds: . acetaminophen  1,000 mg Oral Q8H  . [START ON 05/05/2017] clopidogrel  75 mg Oral Once per day on Mon Wed Fri  . ketorolac  15 mg Intravenous Q6H  . mirabegron ER  50 mg Oral Daily  . rosuvastatin  10 mg Oral Daily  . sodium chloride flush  3 mL Intravenous Q12H   Continuous Infusions: . cefTRIAXone (ROCEPHIN)  IV Stopped (05/04/17 0145)   PRN Meds:.acetaminophen **OR** acetaminophen, ipratropium-albuterol, labetalol, LORazepam, ondansetron **OR** ondansetron (ZOFRAN) IV   Antibiotics   Anti-infectives    Start     Dose/Rate Route Frequency Ordered Stop   05/03/17 2330  cefTRIAXone (ROCEPHIN) 1 g in dextrose 5 % 50 mL IVPB     1  g 100 mL/hr over 30 Minutes Intravenous Daily at bedtime 05/03/17 2319          Subjective:   Kristin Conrad was seen and examined today. Very confused, thinks she is at home does not remember about the fall at all. At the time of my examination, patient had pulled out her IV and was laying in the bed covered with blood all over.    Objective:   Vitals:   05/04/17 0030 05/04/17 0434 05/04/17 0503 05/04/17 0615  BP: 121/80  (!) 163/119 118/69  Pulse: (!) 102  (!) 119 92  Resp: 19  18 18   Temp: 98.1 F (36.7 C)  98.3 F (36.8 C) 99 F (37.2 C)  TempSrc: Oral  Oral Oral  SpO2: 98% 98% 98% 96%  Weight:      Height:        Intake/Output Summary (Last 24 hours) at 05/04/17 1243 Last data filed at 05/04/17 0330  Gross per 24 hour  Intake          2092.59 ml  Output              250 ml  Net           1842.59 ml     Wt Readings from Last 3 Encounters:  05/03/17 71.7 kg (158 lb)     Exam  General: Oriented to herself, time otherwise confused and does not know why she is in the hospital, thinks she is at home   HEENT:  PERRLA, EOMI, Anicteric Sclera, mucous membranes moist.   Neck: Supple, no JVD, no masses  Cardiovascular: S1 S2 auscultated, no rubs, murmurs or gallops. Regular rate and rhythm.  Respiratory: Clear to auscultation bilaterally, no wheezing, rales or rhonchi  Gastrointestinal: Soft, nontender, nondistended, + bowel sounds  Ext: no cyanosis clubbing or edema, both shoulders and slings   NeuroDoes not follow commands  skin: No rashes  Psych; confused   Data Reviewed:  I have personally reviewed following labs and imaging studies  Micro Results No results found for this or any previous visit (from the past 240 hour(s)).  Radiology Reports Dg Shoulder Right  Result Date: 05/03/2017 CLINICAL DATA:  Dislocation, postreduction. EXAM: RIGHT SHOULDER - 2+ VIEW COMPARISON:  Pre reduction exam earlier this day. FINDINGS: Previous dislocation of the right shoulder is been reduced, alignment appears anatomic. No large Hill-Sachs impaction fracture. Acromioclavicular joint is congruent. IMPRESSION: Reduction of prior anterior shoulder dislocation. No fracture or Hill-Sachs impaction injury is visualized. Electronically Signed   By: Rubye Oaks M.D.   On: 05/03/2017 22:14   Dg Shoulder Right  Addendum Date: 05/03/2017   ADDENDUM REPORT: 05/03/2017 19:54 ADDENDUM: Corrected report: IMPRESSION: Dislocation of the right shoulder. Electronically Signed   By: Norva Pavlov M.D.   On: 05/03/2017 19:54   Result Date: 05/03/2017 CLINICAL DATA:  Pain both shoulders s/p fall. Unable to get y views or transthoracic. Due to pt.'s condition could only get ap images EXAM: RIGHT SHOULDER - 2+ VIEW COMPARISON:  None. FINDINGS: There is anterior dislocation of the right  shoulder. Mechanism is appropriate for Hill-Sachs deformity. No acute fracture identified. Right lung apex is clear. IMPRESSION: Negative. Electronically Signed: By: Norva Pavlov M.D. On: 05/03/2017 19:33   Ct Head Wo Contrast  Result Date: 05/03/2017 CLINICAL DATA:  Pt fell today , Tripped at [employer] and landed on hands, chin and chest. EXAM: CT HEAD WITHOUT CONTRAST CT CERVICAL SPINE WITHOUT CONTRAST TECHNIQUE: Multidetector CT imaging of the head  and cervical spine was performed following the standard protocol without intravenous contrast. Multiplanar CT image reconstructions of the cervical spine were also generated. COMPARISON:  None. FINDINGS: CT HEAD FINDINGS Brain: No evidence of acute infarction, hemorrhage, hydrocephalus, extra-axial collection or mass lesion/mass effect. The ventricles are normal configuration. There is ventricular and sulcal enlargement reflecting mild diffuse atrophy. Patchy white matter hypoattenuation is also noted consistent with moderate chronic microvascular ischemic change. Vascular: No hyperdense vessel or unexpected calcification. Skull: Normal. Negative for fracture or focal lesion. Sinuses/Orbits: Globes and orbits are unremarkable. Visualized sinuses and mastoid air cells are clear. Other: None. CT CERVICAL SPINE FINDINGS Alignment: There is a grade 1 anterolisthesis of C5 on C6 and of C6 on C7. No other spondylolisthesis. Skull base and vertebrae: No acute fracture. No primary bone lesion or focal pathologic process. Soft tissues and spinal canal: No prevertebral fluid or swelling. No visible canal hematoma. Disc levels: Bilateral laminectomies have been performed from C3 through C6. There is mild loss of disc height at C5-C6 with moderate to marked loss of disc height at C6-C7. Mild to moderate loss disc height at C7-T1. There are facet degenerative changes bilaterally throughout cervical spine neural foraminal narrowing is noted at multiple levels greatest  bilaterally at C6-C7, severe. There is no convincing disc herniation. Upper chest: No acute findings. There are nodule arising from the left thyroid lobe, largest measuring 2.3 cm. Right lobe is not visualized and may have been surgically removed. Other: None IMPRESSION: HEAD CT:  No acute intracranial abnormalities.  No skull fracture. CERVICAL CT: No fracture or acute finding. Degenerative and postsurgical changes as detailed. Electronically Signed   By: Amie Portlandavid  Ormond M.D.   On: 05/03/2017 18:37   Ct Cervical Spine Wo Contrast  Result Date: 05/03/2017 CLINICAL DATA:  Pt fell today , Tripped at [employer] and landed on hands, chin and chest. EXAM: CT HEAD WITHOUT CONTRAST CT CERVICAL SPINE WITHOUT CONTRAST TECHNIQUE: Multidetector CT imaging of the head and cervical spine was performed following the standard protocol without intravenous contrast. Multiplanar CT image reconstructions of the cervical spine were also generated. COMPARISON:  None. FINDINGS: CT HEAD FINDINGS Brain: No evidence of acute infarction, hemorrhage, hydrocephalus, extra-axial collection or mass lesion/mass effect. The ventricles are normal configuration. There is ventricular and sulcal enlargement reflecting mild diffuse atrophy. Patchy white matter hypoattenuation is also noted consistent with moderate chronic microvascular ischemic change. Vascular: No hyperdense vessel or unexpected calcification. Skull: Normal. Negative for fracture or focal lesion. Sinuses/Orbits: Globes and orbits are unremarkable. Visualized sinuses and mastoid air cells are clear. Other: None. CT CERVICAL SPINE FINDINGS Alignment: There is a grade 1 anterolisthesis of C5 on C6 and of C6 on C7. No other spondylolisthesis. Skull base and vertebrae: No acute fracture. No primary bone lesion or focal pathologic process. Soft tissues and spinal canal: No prevertebral fluid or swelling. No visible canal hematoma. Disc levels: Bilateral laminectomies have been performed from  C3 through C6. There is mild loss of disc height at C5-C6 with moderate to marked loss of disc height at C6-C7. Mild to moderate loss disc height at C7-T1. There are facet degenerative changes bilaterally throughout cervical spine neural foraminal narrowing is noted at multiple levels greatest bilaterally at C6-C7, severe. There is no convincing disc herniation. Upper chest: No acute findings. There are nodule arising from the left thyroid lobe, largest measuring 2.3 cm. Right lobe is not visualized and may have been surgically removed. Other: None IMPRESSION: HEAD CT:  No acute intracranial abnormalities.  No skull fracture. CERVICAL CT: No fracture or acute finding. Degenerative and postsurgical changes as detailed. Electronically Signed   By: Amie Portland M.D.   On: 05/03/2017 18:37   Dg Shoulder Left  Result Date: 05/03/2017 CLINICAL DATA:  Dislocation, postreduction. EXAM: LEFT SHOULDER - 2+ VIEW COMPARISON:  Pre reduction radiographs earlier this day. FINDINGS: Shoulder dislocation has been reduced, alignment is now anatomic. The previous questioned glenoid fracture on prior exam is not visualized, may be obscured by osseous overlap. Acromioclavicular joint is congruent. IMPRESSION: Anatomic alignment postreduction. The previous questioned glenoid fracture is not visualized, may be obscured by osseous overlap. Electronically Signed   By: Rubye Oaks M.D.   On: 05/03/2017 22:16   Dg Shoulder Left  Result Date: 05/03/2017 CLINICAL DATA:  Fall, pain EXAM: LEFT SHOULDER - 2+ VIEW COMPARISON:  None. FINDINGS: AC joint appears intact.  The left lung apex is clear. Inferior dislocation of the left humeral head with respect to the glenoid fossa. Possible cortical fracture along the mid to inferior glenoid rim. IMPRESSION: Inferior dislocation of the left humeral head with respect to the glenoid fossa. Possible small fracture fragments along the mid to inferior glenoid fossa. Electronically Signed   By:  Jasmine Pang M.D.   On: 05/03/2017 19:34    Lab Data:  CBC:  Recent Labs Lab 05/03/17 1657 05/03/17 1709 05/04/17 0342  WBC 8.2  --  13.7*  NEUTROABS 6.2  --   --   HGB 12.3 12.2 13.7  HCT 36.7 36.0 40.5  MCV 87.0  --  86.7  PLT 264  --  323   Basic Metabolic Panel:  Recent Labs Lab 05/03/17 1657 05/03/17 1709 05/04/17 0342  NA 139 142 138  K 3.4* 3.3* 3.6  CL 108 105 107  CO2 21*  --  20*  GLUCOSE 195* 198* 181*  BUN 16 19 13   CREATININE 0.96 0.80 0.74  CALCIUM 8.6*  --  8.7*  MG  --   --  2.4   GFR: Estimated Creatinine Clearance: 49.7 mL/min (by C-G formula based on SCr of 0.74 mg/dL). Liver Function Tests:  Recent Labs Lab 05/03/17 1657  AST 22  ALT 14  ALKPHOS 75  BILITOT 0.6  PROT 5.7*  ALBUMIN 3.4*   No results for input(s): LIPASE, AMYLASE in the last 168 hours. No results for input(s): AMMONIA in the last 168 hours. Coagulation Profile:  Recent Labs Lab 05/03/17 1657  INR 1.01   Cardiac Enzymes: No results for input(s): CKTOTAL, CKMB, CKMBINDEX, TROPONINI in the last 168 hours. BNP (last 3 results) No results for input(s): PROBNP in the last 8760 hours. HbA1C: No results for input(s): HGBA1C in the last 72 hours. CBG: No results for input(s): GLUCAP in the last 168 hours. Lipid Profile: No results for input(s): CHOL, HDL, LDLCALC, TRIG, CHOLHDL, LDLDIRECT in the last 72 hours. Thyroid Function Tests: No results for input(s): TSH, T4TOTAL, FREET4, T3FREE, THYROIDAB in the last 72 hours. Anemia Panel: No results for input(s): VITAMINB12, FOLATE, FERRITIN, TIBC, IRON, RETICCTPCT in the last 72 hours. Urine analysis:    Component Value Date/Time   COLORURINE YELLOW 05/03/2017 1820   APPEARANCEUR HAZY (A) 05/03/2017 1820   LABSPEC 1.014 05/03/2017 1820   PHURINE 6.0 05/03/2017 1820   GLUCOSEU 150 (A) 05/03/2017 1820   HGBUR NEGATIVE 05/03/2017 1820   BILIRUBINUR NEGATIVE 05/03/2017 1820   KETONESUR 20 (A) 05/03/2017 1820    PROTEINUR NEGATIVE 05/03/2017 1820   NITRITE POSITIVE (A) 05/03/2017 1820  LEUKOCYTESUR NEGATIVE 05/03/2017 1820     Ripudeep Rai M.D. [facility] 05/04/2017, 12:43 PM  Pager: [phone] Between 7am to 7pm - call Pager - [phone]  After 7pm go to [email or web address] - password [email or web address]  Call night coverage person covering after 7pm

## 2017-05-05 ENCOUNTER — Inpatient Hospital Stay (HOSPITAL_COMMUNITY): Payer: Medicare Other

## 2017-05-05 DIAGNOSIS — S43004A Unspecified dislocation of right shoulder joint, initial encounter: Secondary | ICD-10-CM

## 2017-05-05 DIAGNOSIS — E876 Hypokalemia: Secondary | ICD-10-CM

## 2017-05-05 DIAGNOSIS — S43005A Unspecified dislocation of left shoulder joint, initial encounter: Secondary | ICD-10-CM

## 2017-05-05 DIAGNOSIS — N390 Urinary tract infection, site not specified: Secondary | ICD-10-CM

## 2017-05-05 DIAGNOSIS — J449 Chronic obstructive pulmonary disease, unspecified: Secondary | ICD-10-CM

## 2017-05-05 DIAGNOSIS — G9341 Metabolic encephalopathy: Secondary | ICD-10-CM

## 2017-05-05 DIAGNOSIS — S43006D Unspecified dislocation of unspecified shoulder joint, subsequent encounter: Secondary | ICD-10-CM

## 2017-05-05 LAB — BASIC METABOLIC PANEL
Anion gap: 9 (ref 5–15)
BUN: 16 mg/dL (ref 6–20)
CHLORIDE: 111 mmol/L (ref 101–111)
CO2: 21 mmol/L — AB (ref 22–32)
CREATININE: 0.84 mg/dL (ref 0.44–1.00)
Calcium: 8.6 mg/dL — ABNORMAL LOW (ref 8.9–10.3)
GFR calc Af Amer: >60 mL/min (ref >60)
GFR calc non Af Amer: >60 mL/min (ref >60)
Glucose, Bld: 109 mg/dL — ABNORMAL HIGH (ref 65–99)
Potassium: 3.9 mmol/L (ref 3.5–5.1)
SODIUM: 141 mmol/L (ref 135–145)

## 2017-05-05 LAB — VITAMIN B12: Vitamin B-12: 521 pg/mL (ref 180–914)

## 2017-05-05 LAB — URINE CULTURE

## 2017-05-05 LAB — CBC
HCT: 38.3 % (ref 36.0–46.0)
HEMOGLOBIN: 12.5 g/dL (ref 12.0–15.0)
MCH: 28.7 pg (ref 26.0–34.0)
MCHC: 32.6 g/dL (ref 30.0–36.0)
MCV: 88 fL (ref 78.0–100.0)
Platelets: 278 10*3/uL (ref 150–400)
RBC: 4.35 MIL/uL (ref 3.87–5.11)
RDW: 13.5 % (ref 11.5–15.5)
WBC: 11.2 10*3/uL — ABNORMAL HIGH (ref 4.0–10.5)

## 2017-05-05 LAB — AMMONIA: AMMONIA: 14 umol/L (ref 9–35)

## 2017-05-05 MED ORDER — MOMETASONE FURO-FORMOTEROL FUM 200-5 MCG/ACT IN AERO
2.0000 | INHALATION_SPRAY | Freq: Two times a day (BID) | RESPIRATORY_TRACT | Status: DC
  Administered 2017-05-06 – 2017-05-11 (×8): 2 via RESPIRATORY_TRACT
  Filled 2017-05-05: qty 8.8

## 2017-05-05 MED ORDER — LORATADINE 10 MG PO TABS
10.0000 mg | ORAL_TABLET | Freq: Every day | ORAL | Status: DC
  Administered 2017-05-06 – 2017-05-11 (×6): 10 mg via ORAL
  Filled 2017-05-05 (×6): qty 1

## 2017-05-05 NOTE — Progress Notes (Signed)
Physical Therapy Treatment Patient Details Name: Kristin Conrad MRN: [id number] DOB: [birth year] Today's Date: 05/05/2017    History of Present Illness Kristin Conrad was outside [employer] yesterday when she began to fall. She held on to the shopping cart with both hands as she began to fall and dislocated both shoulders. She did not lose consciousness. She was brought to [facility] and her dislocations were reduced by the EDP. She was admitted by the IM service. She lives alone in [place], [place] and was here visiting her daughter. Pt is currently delirious (possibly from UTI) and unable to contribute to history which was gleaned from chart and provided by daughter. PMH: COPD, left foot drop, TIA, afib, peripheral neuropathy and previous back and hip surgery and TKA right.     PT Comments    Pt with significant posterior lean in both sitting and standing putting her at very high risk for falling without upper extremity support (which she cannot use right now due to bil NWB).  She is significantly cognitively impaired at this time as well unable to relay how she was injured, what she injured, where she is (city), what month, year, or season it is currently.  She is due to have an MRI this PM.   Follow Up Recommendations  SNF;Supervision - Intermittent     Equipment Recommendations  None recommended by PT    Recommendations for Other Services   NA     Precautions / Restrictions Precautions Precautions: Fall Precaution Comments: posterior lean throughout.  Required Braces or Orthoses: Sling Restrictions Weight Bearing Restrictions: Yes RUE Weight Bearing: Non weight bearing LUE Weight Bearing: Non weight bearing    Mobility  Bed Mobility Overal bed mobility: Needs Assistance Bed Mobility: Supine to Sit     Supine to sit: Mod assist;HOB elevated     General bed mobility comments: Mod assist to help support trunk to come up to sitting, very minimal assist to help progress bil legs to  EOB.  Pt reports she has a brace for her left foot drop, but rarely uses it because it is uncomfortable.   Transfers Overall transfer level: Needs assistance Equipment used: None Transfers: Sit to/from [employer] Sit to Stand: Mod assist Stand pivot transfers: Mod assist       General transfer comment: Mod assist to stand from bed with posterior preference and bil legs pressed against bed for balance, heavier mod assist to transfer to the chair due to posterior lean.  Therapist having to counter balance using the gait belt and even then we fell backwards (controlled by therapist) into the chair.   Ambulation/Gait             General Gait Details: unable to safely at this time.           Balance Overall balance assessment: Needs assistance Sitting-balance support: Feet supported;No upper extremity supported Sitting balance-Leahy Scale: Poor Sitting balance - Comments: mod to max assist EOB pt went from significant posteior lean to anterior lean without warning.  Pt kept telling therapist, I'm not going to fall" but if assist was not provided she would have fallen.  Postural control: Posterior lean;Other (comment) (and then anterior) Standing balance support: No upper extremity supported Standing balance-Leahy Scale: Poor Standing balance comment: mod assist in standing.                             Cognition Arousal/Alertness: Awake/alert Behavior During Therapy: WFL for tasks  assessed/performed Overall Cognitive Status: Impaired/Different from baseline Area of Impairment: Orientation;Attention;Memory;Following commands;Safety/judgement;Awareness;Problem solving                 Orientation Level: Disoriented to;Place;Time;Situation Current Attention Level: Sustained Memory: Decreased recall of precautions;Decreased short-term memory Following Commands: Follows one step commands consistently (to the best of her physical  ability) Safety/Judgement: Decreased awareness of safety;Decreased awareness of deficits Awareness: Intellectual Problem Solving: Difficulty sequencing;Requires verbal cues;Requires tactile cues General Comments: Pt was asleep again when I came in this PM, she was pleasant, however, but could not tell me the town, where she fell, weather it was on the way into the store or out of the store.  She could not tell me the year or month or season.  She could tell me the president is Trump.  She could tell me that she was leaning backwards in sitting and standing, but could not physically correct or prevent the leaning.              Pertinent Vitals/Pain Pain Assessment: Faces Faces Pain Scale: Hurts little more Pain Location: bil shoulders Pain Descriptors / Indicators: Grimacing Pain Intervention(s): Limited activity within patient's tolerance;Monitored during session;Repositioned           PT Goals (current goals can now be found in the care plan section) Acute Rehab PT Goals Patient Stated Goal: daughter would like rehab first, then home with [place] recovered from all of this.  Progress towards PT goals: Progressing toward goals    Frequency    Min 5X/week      PT Plan Current plan remains appropriate       AM-PAC PT "6 Clicks" Daily Activity  Outcome Measure  Difficulty turning over in bed (including adjusting bedclothes, sheets and blankets)?: Total Difficulty moving from lying on back to sitting on the side of the bed? : Total Difficulty sitting down on and standing up from a chair with arms (e.g., wheelchair, bedside commode, etc,.)?: Total Help needed moving to and from a bed to chair (including a wheelchair)?: A Lot Help needed walking in hospital room?: Total Help needed climbing 3-5 steps with a railing? : Total 6 Click Score: 7    End of Session         PT Visit Diagnosis: Unsteadiness on feet (R26.81);Muscle weakness  (generalized) (M62.81);Pain Pain - Right/Left: Right Pain - part of body: Shoulder (bil shoulders)     Time: [id number] PT Time Calculation (min) (ACUTE ONLY): 29 min  Charges:  $Therapeutic Activity: 23-37 mins            Teresia Myint B. Chrystopher Stangl, PT, DPT [phone]            05/05/2017, 3:36 PM

## 2017-05-05 NOTE — Progress Notes (Signed)
Occupational Therapy Treatment Patient Details Name: Kristin Conrad MRN: [id number] DOB: 1930-09-20 Today's Date: 05/05/2017    History of present illness Kristin Conrad was outside [employer] yesterday when she began to fall. She held on to the shopping cart with both hands as she began to fall and dislocated both shoulders. She did not lose consciousness. She was brought to [facility] and her dislocations were reduced by the EDP. She was admitted by the IM service. She lives alone in [place], [place] and was here visiting her daughter. Pt is currently delirious (possibly from UTI) and unable to contribute to history which was gleaned from chart and provided by daughter. PMH: COPD, left foot drop, TIA, afib, peripheral neuropathy and previous back and hip surgery and TKA right.    OT comments  Pt difficult to arouse initially and moved to EOB to increase arousal.  Pt somewhat agitated - insists she is at home, and that she did not dislocate shoulders.  She would not engage in activity despite max cues and encouragement.  Required max - total A  To maintain EOB sitting.   Pt continues to maintain Rt wrist and fingers in flexion, and does not extend them spontaneously or to command.  Will extend thumb with max cues/encouragement.   Will continue to follow.  Recommend SNF.    Follow Up Recommendations  SNF;Supervision/Assistance - 24 hour    Equipment Recommendations  3 in 1 bedside commode;Tub/shower seat;Hospital bed    Recommendations for Other Services      Precautions / Restrictions Precautions Precautions: Fall Required Braces or Orthoses: Sling Restrictions Weight Bearing Restrictions: Yes RUE Weight Bearing: Non weight bearing LUE Weight Bearing: Non weight bearing       Mobility Bed Mobility Overal bed mobility: Needs Assistance Bed Mobility: Sit to Supine;Supine to Sit     Supine to sit: Total assist;+2 for physical assistance Sit to supine: Total assist;+2 for physical  assistance   General bed mobility comments: needs assist with all aspects   Transfers                 General transfer comment: unable     Balance Overall balance assessment: Needs assistance Sitting-balance support: Feet supported Sitting balance-Leahy Scale: Poor Sitting balance - Comments: Pt required max - total A to sit EOB.  Pt pushing heavily posteriorly, then will pitch forward requiring assist to prevent falling forward  Postural control: Posterior lean;Other (comment) (anterior )     Standing balance comment: unable to safely attempt                            ADL either performed or assessed with clinical judgement   ADL Overall ADL's : Needs assistance/impaired                                       General ADL Comments: Pt is unable to participate in functional tasks at this time due to confusion and agitation      Vision       Perception     Praxis      Cognition Arousal/Alertness: Lethargic;Awake/alert Behavior During Therapy: Agitated;WFL for tasks assessed/performed Overall Cognitive Status: Impaired/Different from baseline Area of Impairment: Orientation;Attention;Memory;Following commands;Safety/judgement;Awareness;Problem solving                 Orientation Level: Disoriented to;Place;Time;Situation Current Attention Level: Focused Memory: Decreased  recall of precautions;Decreased short-term memory Following Commands: Follows one step commands inconsistently Safety/Judgement: Decreased awareness of safety;Decreased awareness of deficits   Problem Solving: Slow processing;Decreased initiation;Difficulty sequencing;Requires verbal cues;Requires tactile cues General Comments: Pt initially asleep and difficult to arouse.  Pt moved to EOB to increase arousal and became agitated despite redirection, coaxing, encouragement.   Pt insists she is at home, and that she didn't dislocate her shoulders.  She followed  commands sporadically         Exercises Exercises: Other exercises Other Exercises Other Exercises: Pt attempts to move out of her slings with observable movement bil. UEs, however, continues to maintain Rt wrist in flexion with hand in flexion.  she will flex fingers, but did not extend wrist or digits spontaneously or on command.  Will extend thumb with max encouragement.     Shoulder Instructions       General Comments      Pertinent Vitals/ Pain       Pain Assessment: Faces Faces Pain Scale: Hurts a little bit Pain Location: bil shoulders Pain Descriptors / Indicators: Grimacing Pain Intervention(s): Monitored during session;Repositioned  Home Living Family/patient expects to be discharged to:: Skilled nursing facility Living Arrangements: Children                                      Prior Functioning/Environment              Frequency  Min 2X/week        Progress Toward Goals  OT Goals(current goals can now be found in the care plan section)  Progress towards OT goals: Not progressing toward goals - comment (increased confusion )     Plan Discharge plan remains appropriate    Co-evaluation                 AM-PAC PT "6 Clicks" Daily Activity     Outcome Measure   Help from another person eating meals?: Total Help from another person taking care of personal grooming?: Total Help from another person toileting, which includes using toliet, bedpan, or urinal?: Total Help from another person bathing (including washing, rinsing, drying)?: Total Help from another person to put on and taking off regular upper body clothing?: Total Help from another person to put on and taking off regular lower body clothing?: Total 6 Click Score: 6    End of Session Equipment Utilized During Treatment: Other (comment) (bil. slings )  OT Visit Diagnosis: Unsteadiness on feet (R26.81);Other symptoms and signs involving cognitive function   Activity  Tolerance Patient limited by lethargy;Other (comment) (confusion )   Patient Left in bed;with call bell/phone within reach;with bed alarm set;with family/visitor present   Nurse Communication Mobility status        Time: [id number] OT Time Calculation (min): 22 min  Charges: OT General Charges $OT Visit: 1 Procedure OT Treatments $Therapeutic Activity: 8-22 mins  Reynolds AmericanWendi Khali Albanese, OTR/L [phone]   Jeani HawkingConarpe, Ferne Ellingwood M 05/05/2017, 11:55 AM

## 2017-05-05 NOTE — Progress Notes (Signed)
Patient ID: Kristin CenterElizabeth Eich, female   DOB: 1930/06/07, 83 y.o.   MRN: [id number]   LOS: 1 day   Subjective: Asked by Dr. Janee Mornhompson to reassess pt as she is having some difficulty with motor function. She continues to be confused but is more alert and following commands. On my exam she seems somewhat aphasic.   Objective: Vital signs in last 24 hours: Temp:  [98.1 F (36.7 C)-98.2 F (36.8 C)] 98.2 F (36.8 C) (05/16 0615) Pulse Rate:  [83-96] 83 (05/16 0739) Resp:  [18-19] 19 (05/16 0615) BP: (130-170)/(61-129) 130/61 (05/16 0739) SpO2:  [96 %-98 %] 98 % (05/16 0615) Last BM Date: 05/04/17   Laboratory  CBC  Recent Labs  05/04/17 0342 05/05/17 0518  WBC 13.7* 11.2*  HGB 13.7 12.5  HCT 40.5 38.3  PLT 323 278   BMET  Recent Labs  05/04/17 0342 05/05/17 0518  NA 138 141  K 3.6 3.9  CL 107 111  CO2 20* 21*  GLUCOSE 181* 109*  BUN 13 16  CREATININE 0.74 0.84  CALCIUM 8.7* 8.6*     Physical Exam General appearance: alert and no distress  Neck: no TTP Right shoulder in sling, TTP. Elbow, wrist, digits- no skin wounds, nontender, no instability, no blocks to motion  Sens  Ax/R/M/U intact  Mot   Ax DNT/ R impaired/ PIN impaired/ M/ AIN intact/ U impaired  Rad 2+ Left shoulder in sling, minimal TTP. Elbow, wrist, digits- no skin wounds, nontender, no instability, no blocks to motion  Sens  Ax/R/M/U intact  Mot   Ax DNT/ R/ PIN/ M intact/ AIN/ U impaired  Rad 2+    Assessment/Plan: Bilateral shoulder dislocations, possible glenoid fx left -- Her UE motor exam is confusing. It's possible she has a more proximal injury in her brachial plexus or, given the bilaterality, her c-spine. She may also have a central brain lesion as the etiology. This still may be secondary to her mental status change. Will add MR c-spine to her MR brain, will f/u when done. No changes to plan at this point.    Freeman CaldronMichael J. Halana Deisher, PA-C Orthopedic Surgery 05/05/2017

## 2017-05-05 NOTE — Progress Notes (Deleted)
Pt passed peacefully at 1115 with daughter and caregiver at bedside.  Post mortem checklist, CDS and attending notified.  Palliative NP notified as well.  Husband and other family members at bedside now.  Crisol Muecke S Zaia Carre, RN 

## 2017-05-05 NOTE — Progress Notes (Signed)
PROGRESS NOTE    Kristin Conrad  ZOX:[id number] DOB: Feb 22, 1930 DOA: 05/03/2017 PCP: System, Pcp Not In (Confirm with patient/family/NH records and if not entered, this HAS to be entered at [facility] point of entry. "No PCP" if truly none.)   Brief Narrative:  Kristin Conrad a 83 y.o.woman with a history of TIA, COPD, peripheral neuropathy, and left foot drop who was in her baseline state of health until she had a mechanical fall outside of [employer] On the day of admission witnessed by her daughter. The patient's daughter reported that patient has chronic bilateral lower extremity weakness, there was no loss of consciousness. She immediately had bilateral shoulder pain. X-ray showed bilateral shoulder dislocations. UA was positive for UTI. Both shoulders were reduced in the ED with conscious sedation. Patient was also found to have acute encephalopathy likely due to UTI. She was admitted for further workup.    Assessment & Plan:   Principal Problem:   Shoulder dislocation Active Problems:   Fall   Hypokalemia   COPD (chronic obstructive pulmonary disease) (HCC)   Acute lower UTI   Acute metabolic encephalopathy  #1 bilateral shoulder dislocation/secondary to mechanical fall. Status post reduction with conscious sedation Patient has been seen in consultation by orthopedics Dr. Aundria Rud who is recommended nonweightbearing in sling for 1 week with outpatient follow-up and starting the PT program. Patient has been seen by physical therapy recommended skilled nursing facility. Patient unable to open right hand after making a fist will have orthopedics reassess.  #2 acute metabolic encephalopathy Patient alert to self and place however not sure of the year. Initial concern was acute encephalopathy secondary to UTI. Urine cultures negative. Patient with no respiratory symptoms. Will check an MRI of the head, ammonia level, RPR, RBC folate, B-12 levels, TSH. Follow. Continue empiric IV  antibiotics.  #3 UTI Urine cultures negative. Treat empirically with IV Rocephin. A   #4 hypokalemia Repleted.  #5 COPD Stable. Resume Advair.  #6 history of TIA Continue Plavix for secondary stroke prevention.  #7 hyperlipidemia Continue statin.   DVT prophylaxis: SCD Code Status: DO NOT RESUSCITATE Family Communication: Updated patient and daughter at bedside. Disposition Plan: Skilled nursing facility   Consultants:   Orthopedics: Dr. Aundria Rud 05/04/2017  Procedures:   CT head C-spine 05/03/2017  Plain films of bilateral shoulders 05/03/2017  Antimicrobials:   IV Rocephin 05/03/2017   Subjective: Patient alert to self and place. Patient not sure what year it is or what month it is. Patient seems frustrated. Patient with no slurred speech. Patient with no focal neurological deficits.  Objective: Vitals:   05/04/17 1300 05/04/17 2200 05/05/17 0615 05/05/17 0739  BP: (!) 162/62 (!) 158/70 (!) 170/129 130/61  Pulse: 91 93 96 83  Resp: 19 18 19    Temp: 98.2 F (36.8 C) 98.1 F (36.7 C) 98.2 F (36.8 C)   TempSrc: Oral Oral Oral   SpO2:  96% 98%   Weight:      Height:        Intake/Output Summary (Last 24 hours) at 05/05/17 1218 Last data filed at 05/05/17 1000  Gross per 24 hour  Intake              293 ml  Output                0 ml  Net              293 ml   Filed Weights   05/03/17 1534  Weight: 71.7 kg (158  lb)    Examination:  General exam: Appears calm and comfortable  Respiratory system: Clear to auscultation Anterior lung fields. Respiratory effort normal. Cardiovascular system: S1 & S2 heard, RRR. No JVD, murmurs, rubs, gallops or clicks. No pedal edema. Gastrointestinal system: Abdomen is nondistended, soft and nontender. No organomegaly or masses felt. Normal bowel sounds heard. Central nervous system: Alert and oriented to self and place.. No focal neurological deficits. Extremities: Bilateral upper extremities in sling. Unable to  open right hand from a fist. Skin: No rashes, lesions or ulcers Psychiatry: Judgement and insight appear poor-fair. Mood & affect appropriate.     Data Reviewed: I have personally reviewed following labs and imaging studies  CBC:  Recent Labs Lab 05/03/17 1657 05/03/17 1709 05/04/17 0342 05/05/17 0518  WBC 8.2  --  13.7* 11.2*  NEUTROABS 6.2  --   --   --   HGB 12.3 12.2 13.7 12.5  HCT 36.7 36.0 40.5 38.3  MCV 87.0  --  86.7 88.0  PLT 264  --  323 278   Basic Metabolic Panel:  Recent Labs Lab 05/03/17 1657 05/03/17 1709 05/04/17 0342 05/05/17 0518  NA 139 142 138 141  K 3.4* 3.3* 3.6 3.9  CL 108 105 107 111  CO2 21*  --  20* 21*  GLUCOSE 195* 198* 181* 109*  BUN 16 19 13 16   CREATININE 0.96 0.80 0.74 0.84  CALCIUM 8.6*  --  8.7* 8.6*  MG  --   --  2.4  --    GFR: Estimated Creatinine Clearance: 47.3 mL/min (by C-G formula based on SCr of 0.84 mg/dL). Liver Function Tests:  Recent Labs Lab 05/03/17 1657  AST 22  ALT 14  ALKPHOS 75  BILITOT 0.6  PROT 5.7*  ALBUMIN 3.4*   No results for input(s): LIPASE, AMYLASE in the last 168 hours. No results for input(s): AMMONIA in the last 168 hours. Coagulation Profile:  Recent Labs Lab 05/03/17 1657  INR 1.01   Cardiac Enzymes: No results for input(s): CKTOTAL, CKMB, CKMBINDEX, TROPONINI in the last 168 hours. BNP (last 3 results) No results for input(s): PROBNP in the last 8760 hours. HbA1C: No results for input(s): HGBA1C in the last 72 hours. CBG: No results for input(s): GLUCAP in the last 168 hours. Lipid Profile: No results for input(s): CHOL, HDL, LDLCALC, TRIG, CHOLHDL, LDLDIRECT in the last 72 hours. Thyroid Function Tests: No results for input(s): TSH, T4TOTAL, FREET4, T3FREE, THYROIDAB in the last 72 hours. Anemia Panel: No results for input(s): VITAMINB12, FOLATE, FERRITIN, TIBC, IRON, RETICCTPCT in the last 72 hours. Sepsis Labs: No results for input(s): PROCALCITON, LATICACIDVEN in the  last 168 hours.  Recent Results (from the past 240 hour(s))  Urine culture     Status: Abnormal   Collection Time: 05/04/17  6:20 AM  Result Value Ref Range Status   Specimen Description URINE, CLEAN CATCH  Final   Special Requests NONE  Final   Culture MULTIPLE SPECIES PRESENT, SUGGEST RECOLLECTION (A)  Final   Report Status 05/05/2017 FINAL  Final         Radiology Studies: Dg Shoulder Right  Result Date: 05/03/2017 CLINICAL DATA:  Dislocation, postreduction. EXAM: RIGHT SHOULDER - 2+ VIEW COMPARISON:  Pre reduction exam earlier this day. FINDINGS: Previous dislocation of the right shoulder is been reduced, alignment appears anatomic. No large Hill-Sachs impaction fracture. Acromioclavicular joint is congruent. IMPRESSION: Reduction of prior anterior shoulder dislocation. No fracture or Hill-Sachs impaction injury is visualized. Electronically Signed  By: Rubye OaksMelanie  Ehinger M.D.   On: 05/03/2017 22:14   Dg Shoulder Right  Addendum Date: 05/03/2017   ADDENDUM REPORT: 05/03/2017 19:54 ADDENDUM: Corrected report: IMPRESSION: Dislocation of the right shoulder. Electronically Signed   By: Norva PavlovElizabeth  Brown M.D.   On: 05/03/2017 19:54   Result Date: 05/03/2017 CLINICAL DATA:  Pain both shoulders s/p fall. Unable to get y views or transthoracic. Due to pt.'s condition could only get ap images EXAM: RIGHT SHOULDER - 2+ VIEW COMPARISON:  None. FINDINGS: There is anterior dislocation of the right shoulder. Mechanism is appropriate for Hill-Sachs deformity. No acute fracture identified. Right lung apex is clear. IMPRESSION: Negative. Electronically Signed: By: Norva PavlovElizabeth  Brown M.D. On: 05/03/2017 19:33   Ct Head Wo Contrast  Result Date: 05/03/2017 CLINICAL DATA:  Pt fell today , [place] [employer] and landed on hands, chin and chest. EXAM: CT HEAD WITHOUT CONTRAST CT CERVICAL SPINE WITHOUT CONTRAST TECHNIQUE: Multidetector CT imaging of the head and cervical spine was performed following the  standard protocol without intravenous contrast. Multiplanar CT image reconstructions of the cervical spine were also generated. COMPARISON:  None. FINDINGS: CT HEAD FINDINGS Brain: No evidence of acute infarction, hemorrhage, hydrocephalus, extra-axial collection or mass lesion/mass effect. The ventricles are normal configuration. There is ventricular and sulcal enlargement reflecting mild diffuse atrophy. Patchy white matter hypoattenuation is also noted consistent with moderate chronic microvascular ischemic change. Vascular: No hyperdense vessel or unexpected calcification. Skull: Normal. Negative for fracture or focal lesion. Sinuses/Orbits: Globes and orbits are unremarkable. Visualized sinuses and mastoid air cells are clear. Other: None. CT CERVICAL SPINE FINDINGS Alignment: There is a grade 1 anterolisthesis of C5 on C6 and of C6 on C7. No other spondylolisthesis. Skull base and vertebrae: No acute fracture. No primary bone lesion or focal pathologic process. Soft tissues and spinal canal: No prevertebral fluid or swelling. No visible canal hematoma. Disc levels: Bilateral laminectomies have been performed from C3 through C6. There is mild loss of disc height at C5-C6 with moderate to marked loss of disc height at C6-C7. Mild to moderate loss disc height at C7-T1. There are facet degenerative changes bilaterally throughout cervical spine neural foraminal narrowing is noted at multiple levels greatest bilaterally at C6-C7, severe. There is no convincing disc herniation. Upper chest: No acute findings. There are nodule arising from the left thyroid lobe, largest measuring 2.3 cm. Right lobe is not visualized and may have been surgically removed. Other: None IMPRESSION: HEAD CT:  No acute intracranial abnormalities.  No skull fracture. CERVICAL CT: No fracture or acute finding. Degenerative and postsurgical changes as detailed. Electronically Signed   By: Amie Portlandavid  Ormond M.D.   On: 05/03/2017 18:37   Ct  Cervical Spine Wo Contrast  Result Date: 05/03/2017 CLINICAL DATA:  Pt fell today , [place] [employer] and landed on hands, chin and chest. EXAM: CT HEAD WITHOUT CONTRAST CT CERVICAL SPINE WITHOUT CONTRAST TECHNIQUE: Multidetector CT imaging of the head and cervical spine was performed following the standard protocol without intravenous contrast. Multiplanar CT image reconstructions of the cervical spine were also generated. COMPARISON:  None. FINDINGS: CT HEAD FINDINGS Brain: No evidence of acute infarction, hemorrhage, hydrocephalus, extra-axial collection or mass lesion/mass effect. The ventricles are normal configuration. There is ventricular and sulcal enlargement reflecting mild diffuse atrophy. Patchy white matter hypoattenuation is also noted consistent with moderate chronic microvascular ischemic change. Vascular: No hyperdense vessel or unexpected calcification. Skull: Normal. Negative for fracture or focal lesion. Sinuses/Orbits: Globes and orbits are unremarkable. Visualized  sinuses and mastoid air cells are clear. Other: None. CT CERVICAL SPINE FINDINGS Alignment: There is a grade 1 anterolisthesis of C5 on C6 and of C6 on C7. No other spondylolisthesis. Skull base and vertebrae: No acute fracture. No primary bone lesion or focal pathologic process. Soft tissues and spinal canal: No prevertebral fluid or swelling. No visible canal hematoma. Disc levels: Bilateral laminectomies have been performed from C3 through C6. There is mild loss of disc height at C5-C6 with moderate to marked loss of disc height at C6-C7. Mild to moderate loss disc height at C7-T1. There are facet degenerative changes bilaterally throughout cervical spine neural foraminal narrowing is noted at multiple levels greatest bilaterally at C6-C7, severe. There is no convincing disc herniation. Upper chest: No acute findings. There are nodule arising from the left thyroid lobe, largest measuring 2.3 cm. Right lobe is not visualized and  may have been surgically removed. Other: None IMPRESSION: HEAD CT:  No acute intracranial abnormalities.  No skull fracture. CERVICAL CT: No fracture or acute finding. Degenerative and postsurgical changes as detailed. Electronically Signed   By: Amie Portland M.D.   On: 05/03/2017 18:37   Dg Shoulder Left  Result Date: 05/03/2017 CLINICAL DATA:  Dislocation, postreduction. EXAM: LEFT SHOULDER - 2+ VIEW COMPARISON:  Pre reduction radiographs earlier this day. FINDINGS: Shoulder dislocation has been reduced, alignment is now anatomic. The previous questioned glenoid fracture on prior exam is not visualized, may be obscured by osseous overlap. Acromioclavicular joint is congruent. IMPRESSION: Anatomic alignment postreduction. The previous questioned glenoid fracture is not visualized, may be obscured by osseous overlap. Electronically Signed   By: Rubye Oaks M.D.   On: 05/03/2017 22:16   Dg Shoulder Left  Result Date: 05/03/2017 CLINICAL DATA:  Fall, pain EXAM: LEFT SHOULDER - 2+ VIEW COMPARISON:  None. FINDINGS: AC joint appears intact.  The left lung apex is clear. Inferior dislocation of the left humeral head with respect to the glenoid fossa. Possible cortical fracture along the mid to inferior glenoid rim. IMPRESSION: Inferior dislocation of the left humeral head with respect to the glenoid fossa. Possible small fracture fragments along the mid to inferior glenoid fossa. Electronically Signed   By: Jasmine Pang M.D.   On: 05/03/2017 19:34        Scheduled Meds: . acetaminophen  1,000 mg Oral Q8H  . clopidogrel  75 mg Oral Once per day on Mon Wed Fri  . ketorolac  15 mg Intravenous Q6H  . mirabegron ER  50 mg Oral Daily  . rosuvastatin  10 mg Oral Daily  . sodium chloride flush  3 mL Intravenous Q12H   Continuous Infusions: . cefTRIAXone (ROCEPHIN)  IV Stopped (05/04/17 2220)     LOS: 1 day    Time spent: 40 minutes    Milania Haubner, MD [facility] Pager  [phone]  If 7PM-7AM, please contact night-coverage [email or web address] Password [facility] 05/05/2017, 12:18 PM

## 2017-05-06 DIAGNOSIS — G5693 Unspecified mononeuropathy of bilateral upper limbs: Secondary | ICD-10-CM

## 2017-05-06 LAB — CBC
HCT: 36.9 % (ref 36.0–46.0)
Hemoglobin: 12.3 g/dL (ref 12.0–15.0)
MCH: 29.1 pg (ref 26.0–34.0)
MCHC: 33.3 g/dL (ref 30.0–36.0)
MCV: 87.4 fL (ref 78.0–100.0)
PLATELETS: 281 10*3/uL (ref 150–400)
RBC: 4.22 MIL/uL (ref 3.87–5.11)
RDW: 13.3 % (ref 11.5–15.5)
WBC: 9 10*3/uL (ref 4.0–10.5)

## 2017-05-06 LAB — FOLATE RBC
Folate, Hemolysate: >620 ng/mL
Hematocrit: 38.1 % (ref 34.0–46.6)

## 2017-05-06 LAB — BASIC METABOLIC PANEL
ANION GAP: 10 (ref 5–15)
BUN: 18 mg/dL (ref 6–20)
CALCIUM: 8.4 mg/dL — AB (ref 8.9–10.3)
CO2: 19 mmol/L — ABNORMAL LOW (ref 22–32)
CREATININE: 0.82 mg/dL (ref 0.44–1.00)
Chloride: 111 mmol/L (ref 101–111)
GFR calc Af Amer: >60 mL/min (ref >60)
GLUCOSE: 111 mg/dL — AB (ref 65–99)
Potassium: 3.7 mmol/L (ref 3.5–5.1)
Sodium: 140 mmol/L (ref 135–145)

## 2017-05-06 LAB — RPR: RPR Ser Ql: NONREACTIVE

## 2017-05-06 LAB — TSH: TSH: 1.627 u[IU]/mL (ref 0.350–4.500)

## 2017-05-06 LAB — HIV ANTIBODY (ROUTINE TESTING W REFLEX): HIV Screen 4th Generation wRfx: NONREACTIVE

## 2017-05-06 LAB — MAGNESIUM: Magnesium: 2 mg/dL (ref 1.7–2.4)

## 2017-05-06 MED ORDER — CARVEDILOL 3.125 MG PO TABS
3.1250 mg | ORAL_TABLET | Freq: Two times a day (BID) | ORAL | Status: DC
  Administered 2017-05-06 – 2017-05-11 (×11): 3.125 mg via ORAL
  Filled 2017-05-06 (×11): qty 1

## 2017-05-06 MED ORDER — POTASSIUM CHLORIDE CRYS ER 20 MEQ PO TBCR
40.0000 meq | EXTENDED_RELEASE_TABLET | Freq: Once | ORAL | Status: AC
  Administered 2017-05-06: 40 meq via ORAL
  Filled 2017-05-06: qty 2

## 2017-05-06 MED ORDER — AMLODIPINE BESYLATE 5 MG PO TABS
5.0000 mg | ORAL_TABLET | Freq: Every day | ORAL | Status: DC
Start: 2017-05-06 — End: 2017-05-11
  Administered 2017-05-06 – 2017-05-11 (×6): 5 mg via ORAL
  Filled 2017-05-06 (×6): qty 1

## 2017-05-06 NOTE — Progress Notes (Signed)
Physical medicine rehabilitation consult requested chart reviewed. Spoke at length with daughter who has requested [place] as [place] be more convenient for her family versus skilled nursing facility. We discussed all options again her request was [facility] skilled nursing facility. In light of these requests will hold on formal rehabilitation consult at this time and defer to case management for discharge planning

## 2017-05-06 NOTE — Progress Notes (Signed)
Occupational Therapy Progress Note  Splint wear schedule established and posted in room, and entered in chart.  RN, daughter, and pt were instructed on purpose of splint, wear schedule, and precautions.   Will monitor.    05/06/17 1600  OT Visit Information  Last OT Received On 05/06/17  Assistance Needed +2  History of Present Illness Analy was outside [employer] yesterday when she began to fall. She held on to the shopping cart with both hands as she began to fall and dislocated both shoulders. She did not lose consciousness. She was brought to [facility] and her dislocations were reduced by the EDP. She was admitted by the IM service. She lives alone in [place], [place] and was here visiting her daughter. Pt is currently delirious (possibly from UTI) and unable to contribute to history which was gleaned from chart and provided by daughter. PMH: COPD, left foot drop, TIA, afib, peripheral neuropathy and previous back and hip surgery and TKA right.   MRI negative for acute events in the brain, cervical spine does have some  Severe neural foraminal narrowing C3-4 thru C6-7  Precautions  Precautions Fall  Precaution Comments posterior lean throughout.   Required Braces or Orthoses Sling  Pain Assessment  Pain Assessment Faces  Faces Pain Scale 2  Pain Location bil shoulders  Pain Descriptors / Indicators Grimacing;Guarding  Pain Intervention(s) Monitored during session  Cognition  Arousal/Alertness Awake/alert  Behavior During Therapy WFL for tasks assessed/performed  Overall Cognitive Status Impaired/Different from baseline  Area of Impairment Orientation;Attention;Memory;Following commands;Safety/judgement;Awareness;Problem solving  Orientation Level Disoriented to;Place;Time  Current Attention Level Sustained  Memory Decreased recall of precautions;Decreased short-term memory  Following Commands Follows one step commands consistently  Safety/Judgement Decreased awareness of  safety;Decreased awareness of deficits  Problem Solving Difficulty sequencing;Decreased initiation;Slow processing;Requires verbal cues;Requires tactile cues  General Comments Pt more irritable and confused this pm   Restrictions  Weight Bearing Restrictions Yes  RUE Weight Bearing NWB  LUE Weight Bearing NWB  Other Position/Activity Restrictions NWB and slings at all times x 1 wk.  Gentle pendulums   Transfers  Sit to Stand Mod assist  General Comments  General comments (skin integrity, edema, etc.) Pt, daughter, and RN instructed in purpose of wrist splint.  Splint schedule established with sign posted over bed.   Pt requires max reinforcement to keep slings in place and for splint wear   OT - End of Session  Equipment Utilized During Treatment (bil. slings )  Activity Tolerance Patient limited by fatigue  Patient left with call bell/phone within reach;with chair alarm set;with family/visitor present;in chair  Nurse Communication Mobility status  OT Assessment/Plan  OT Plan Discharge plan remains appropriate  OT Visit Diagnosis Cognitive communication deficit (R41.841);Unsteadiness on feet (R26.81)  OT Frequency (ACUTE ONLY) Min 2X/week  Follow Up Recommendations SNF;Supervision/Assistance - 24 hour  OT Equipment 3 in 1 bedside commode;Tub/shower seat;Hospital bed  AM-PAC OT "6 Clicks" Daily Activity Outcome Measure  Help from another person eating meals? 1  Help from another person taking care of personal grooming? 1  Help from another person toileting, which includes using toliet, bedpan, or urinal? 1  Help from another person bathing (including washing, rinsing, drying)? 1  Help from another person to put on and taking off regular upper body clothing? 1  Help from another person to put on and taking off regular lower body clothing? 1  6 Click Score 6  ADL G Code Conversion CN  OT Goal Progression  Progress towards OT goals  Progressing toward goals  OT Time Calculation  OT  Start Time (ACUTE ONLY) 1600  OT Stop Time (ACUTE ONLY) 1619  OT Time Calculation (min) 19 min  OT General Charges  $OT Visit 1 Procedure  OT Treatments  $Orthotics/Prosthetics Check 8-22 mins  Reynolds AmericanWendi Rohnan Bartleson, OTR/L [phone]
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
## 2017-05-06 NOTE — Progress Notes (Signed)
Orthopedic Tech Progress Note Patient Details:  Kristin Conrad 05/03/1930 [id number]  Ortho Devices Type of Ortho Device: Velcro wrist splint Ortho Device/Splint Location: rue Ortho Device/Splint Interventions: Application   Kristin Conrad 05/06/2017, 3:35 PM

## 2017-05-06 NOTE — Progress Notes (Addendum)
Occupational Therapy Treatment Patient Details Name: Kristin Conrad MRN: [id number] DOB: 04-Aug-1930 Today's Date: 05/06/2017    History of present illness Kristin Conrad was outside [employer] yesterday when she began to fall. She held on to the shopping cart with both hands as she began to fall and dislocated both shoulders. She did not lose consciousness. She was brought to [facility] and her dislocations were reduced by the EDP. She was admitted by the IM service. She lives alone in [place], [place] and was here visiting her daughter. Pt is currently delirious (possibly from UTI) and unable to contribute to history which was gleaned from chart and provided by daughter. PMH: COPD, left foot drop, TIA, afib, peripheral neuropathy and previous back and hip surgery and TKA right.   MRI negative for acute events in the brain, cervical spine does have some  Severe neural foraminal narrowing C3-4 thru C6-7   OT comments  Pt with improved cognition today.  Her behaviors are appropriate and she is able to follow one step commands consistently, however, she remains confused and disoriented to place and time.  She demonstrates motor and sensory deficits bil. UEs, and demonstrates poor standing balance with significant posterior lean.  She requires total A for ADLs and mod A +2 for functioanl mobility. Recommend Wrist splint for Rt wrist - verbal order obtained from MD.     Recommend SNF level rehab at discharge.   Follow Up Recommendations  SNF;Supervision/Assistance - 24 hour    Equipment Recommendations  3 in 1 bedside commode;Tub/shower seat;Hospital bed    Recommendations for Other Services      Precautions / Restrictions Precautions Precautions: Fall Precaution Comments: posterior lean throughout.  Required Braces or Orthoses: Sling Restrictions Weight Bearing Restrictions: Yes RUE Weight Bearing: Non weight bearing LUE Weight Bearing: Non weight bearing Other Position/Activity Restrictions: NWB and  slings at all times x 1 wk.  Gentle pendulums        Mobility Bed Mobility               General bed mobility comments: Pt sitting up in chair   Transfers Overall transfer level: Needs assistance Equipment used: None Transfers: Sit to/from [employer] Transfers Sit to Stand: Mod assist Stand pivot transfers: Mod assist;+2 safety/equipment       General transfer comment: worked on standing balance in front of chair x 2     Balance Overall balance assessment: Needs assistance Sitting-balance support: No upper extremity supported;Feet supported Sitting balance-Leahy Scale: Poor Sitting balance - Comments: min guard assist to sit EOC today.  She is still resteless, but able to correct posture sponatneously  Postural control: Posterior lean Standing balance support: No upper extremity supported Standing balance-Leahy Scale: Poor Standing balance comment: Pt with heavy posterior lean, shoulders signifcantly posterior to feet.  She is able to correct with mod cues for very brief periods, then will collapse into the chair                            ADL either performed or assessed with clinical judgement   ADL Overall ADL's : Needs assistance/impaired Eating/Feeding: Total assistance   Grooming: Total assistance   Upper Body Bathing: Total assistance   Lower Body Bathing: Total assistance   Upper Body Dressing : Total assistance   Lower Body Dressing: Total assistance   Toilet Transfer: Moderate assistance;+2 for physical assistance Toilet Transfer Details (indicate cue type and reason): heavy posterior lean.  Pt does  not appear to have any perception of where she is in space  Toileting- Architect and Hygiene: Total assistance       Functional mobility during ADLs: Moderate assistance;+2 for physical assistance;+2 for safety/equipment       Vision       Perception     Praxis      Cognition Arousal/Alertness:  Awake/alert Behavior During Therapy: WFL for tasks assessed/performed Overall Cognitive Status: Impaired/Different from baseline Area of Impairment: Orientation;Attention;Memory;Following commands;Safety/judgement;Awareness;Problem solving                 Orientation Level: Disoriented to;Place;Time Current Attention Level: Sustained Memory: Decreased recall of precautions;Decreased short-term memory Following Commands: Follows one step commands consistently Safety/Judgement: Decreased awareness of safety;Decreased awareness of deficits Awareness: Intellectual Problem Solving: Difficulty sequencing;Decreased initiation;Slow processing;Requires verbal cues;Requires tactile cues General Comments: Pt pleasant and appropriate today.  She thinks it's September, and with cues was able to state she is at hospital, and that she dislocated her shoulders, however, she is unable to retain precautions as she requires constant cues to stay in the slings and not attempt to use UEs.  She follows commands consistently         Exercises Other Exercises Other Exercises: Pt still with no wrist or MCP ext.   Trace elbow flexion.  Lt, pt with 1/5 finger flexion and 2/5 biceps.  Formal sensory testing not performed    Shoulder Instructions       General Comments      Pertinent Vitals/ Pain       Pain Assessment: Faces Faces Pain Scale: Hurts a little bit Pain Location: bil shoulders Pain Descriptors / Indicators: Grimacing;Guarding Pain Intervention(s): Limited activity within patient's tolerance  Home Living                                          Prior Functioning/Environment              Frequency  Min 2X/week        Progress Toward Goals  OT Goals(current goals can now be found in the care plan section)  Progress towards OT goals: Progressing toward goals  Acute Rehab OT Goals Patient Stated Goal: pt wants many things scratched because she is itchy.    Plan Discharge plan remains appropriate    Co-evaluation                 AM-PAC PT "6 Clicks" Daily Activity     Outcome Measure   Help from another person eating meals?: Total Help from another person taking care of personal grooming?: Total Help from another person toileting, which includes using toliet, bedpan, or urinal?: Total Help from another person bathing (including washing, rinsing, drying)?: Total Help from another person to put on and taking off regular upper body clothing?: Total Help from another person to put on and taking off regular lower body clothing?: Total 6 Click Score: 6    End of Session Equipment Utilized During Treatment: Gait belt;Other (comment) (bil. slings )  OT Visit Diagnosis: Cognitive communication deficit (R41.841);Unsteadiness on feet (R26.81)   Activity Tolerance Patient limited by fatigue   Patient Left in bed;with call bell/phone within reach;with chair alarm set;with family/visitor present   Nurse Communication Mobility status        Time: 9528-4132 OT Time Calculation (min): 31 min  Charges: OT General Charges $OT Visit: 1  Procedure OT Treatments $Neuromuscular Re-education: 23-37 mins  Reynolds AmericanWendi Mihailo Sage, OTR/L [phone]    Jeani HawkingConarpe, Dong Nimmons M 05/06/2017, 3:14 PM

## 2017-05-06 NOTE — Progress Notes (Signed)
[facility] paged CMT notified that patient has 3 beat run VTach and non sustained SVT HR 166. Patient bp 189/82 asymptomatic. Ilean SkillVeronica Quintavia Rogstad LPN

## 2017-05-06 NOTE — Progress Notes (Signed)
Patient ID: Kristin Conrad, female   DOB: 07-22-1930, 83 y.o.   MRN: [id number]   LOS: 2 days   Subjective: Much more alert, minimal c/o pain, still with difficulty moving hands.   Objective: Vital signs in last 24 hours: Temp:  [98.1 F (36.7 C)-98.6 F (37 C)] 98.3 F (36.8 C) (05/17 0442) Pulse Rate:  [81-92] 92 (05/17 0442) Resp:  [18-20] 18 (05/17 0442) BP: (163-189)/(69-82) 163/75 (05/17 0442) SpO2:  [96 %] 96 % (05/17 0442) Last BM Date: 05/04/17   Laboratory  CBC  Recent Labs  05/05/17 0518 05/06/17 0433  WBC 11.2* 9.0  HGB 12.5 12.3  HCT 38.3 36.9  PLT 278 281   BMET  Recent Labs  05/05/17 0518 05/06/17 0433  NA 141 140  K 3.9 3.7  CL 111 111  CO2 21* 19*  GLUCOSE 109* 111*  BUN 16 18  CREATININE 0.84 0.82  CALCIUM 8.6* 8.4*    Physical Exam General appearance: alert and no distress  Right shoulder, elbow, wrist, digits- no skin wounds, nontender, no instability, no blocks to motion  Sens  Ax/R/M/U paresthetic  Mot   Ax DNT/ R impaired/ PIN impaired/ M weak/ AIN intact/ U impaired  Rad 2+  Left shoulder, elbow, wrist, digits- no skin wounds, nontender, no instability, no blocks to motion  Sens  Ax/R/M/U intact  Mot   Ax DNT/ R intact/ PIN intact/ M intact/ AIN impaired/ U impaired  Rad 2+    Assessment/Plan: Bilateral shoulder dislocations, possible glenoid fx left BUE neuropathy -- MR brain/c-spine did not show any neurologic lesion though she has significant foraminal narrowing. Suspect radicular injury or plexus injury through the shoulder during her dislocations. No change in treatment, continue PT/OT. May benefit from CIR, will leave to primary team to decide. Daughter would like placement in [place] if possible. She is able to return home with daughter at discharge so CIR could be possibility. May be helpful to get neurology or neurosurgery on board for long-term f/u. Please call with questions.    Freeman CaldronMichael J. Kalyb Pemble, PA-C Orthopedic  Surgery 05/06/2017

## 2017-05-06 NOTE — Progress Notes (Signed)
Physical Therapy Treatment Patient Details Name: Kristin Conrad MRN: [id number] DOB: 09/23/1930 Today's Date: 05/06/2017    History of Present Illness Angelly was outside [employer] yesterday when she began to fall. She held on to the shopping cart with both hands as she began to fall and dislocated both shoulders. She did not lose consciousness. She was brought to [facility] and her dislocations were reduced by the EDP. She was admitted by the IM service. She lives alone in [place], [place] and was here visiting her daughter. Pt is currently delirious (possibly from UTI) and unable to contribute to history which was gleaned from chart and provided by daughter. PMH: COPD, left foot drop, TIA, afib, peripheral neuropathy and previous back and hip surgery and TKA right.   MRI negative for acute events in the brain, cervical spine does have some  Severe neural foraminal narrowing C3-4 thru C6-7    PT Comments    Pt is progressing with her mobility.  She was less of a heavy assist today with less significant posterior lean.  Some signs of ataxia in her LEs while stepping to the chair.  If we have +3 assist tomorrow we can likely progress to short distance gait and really get a sense of her LE coordination.  Daughter can push chair if she is here, otherwise, I may have mobility tech Tamika help me again (she did with this session).     Follow Up Recommendations  SNF;Supervision - Intermittent     Equipment Recommendations  None recommended by PT    Recommendations for Other Services   NA     Precautions / Restrictions Precautions Precautions: Fall Precaution Comments: posterior lean throughout.  Required Braces or Orthoses: Sling (bil) Restrictions RUE Weight Bearing: Non weight bearing LUE Weight Bearing: Non weight bearing    Mobility  Bed Mobility               General bed mobility comments: Pt seated EOB with mobility tech when PT arrived.   Transfers Overall transfer level:  Needs assistance Equipment used: None Transfers: Sit to/from [employer] Sit to Stand: Mod assist;+2 safety/equipment Stand pivot transfers: Mod assist;+2 safety/equipment       General transfer comment: Lighter mod assist today to get to standing and to turn to the recliner chair.  Pt needed encouragement for more of an anterior lean (she is still more posterior in standing and with transition to recliner chair).  Ataxic steps to the chair on her right side.    Ambulation/Gait             General Gait Details: I think if we had three people we can progress to short distance gait next session with two at her body and one person following with the chair.           Balance Overall balance assessment: Needs assistance Sitting-balance support: No upper extremity supported;Feet supported Sitting balance-Leahy Scale: Poor Sitting balance - Comments: min guard assist today for light touches to find midline, when given cues to correct pt often over corrects.  Postural control: Posterior lean Standing balance support: No upper extremity supported Standing balance-Leahy Scale: Poor Standing balance comment: needs mod assist for balance in standing.                             Cognition Arousal/Alertness: Awake/alert Behavior During Therapy: WFL for tasks assessed/performed Overall Cognitive Status: Impaired/Different from baseline Area of Impairment: Attention;Memory;Following  commands;Awareness;Safety/judgement;Problem solving                   Current Attention Level: Sustained Memory: Decreased recall of precautions;Decreased short-term memory Following Commands: Follows one step commands consistently Safety/Judgement: Decreased awareness of safety;Decreased awareness of deficits Awareness: Intellectual Problem Solving: Difficulty sequencing;Requires verbal cues;Requires tactile cues General Comments: Pt seems a bit better cognitively today.   She does need near constant reinforcement not to use or lean on either arm.  She also wants PT and mobility tech to losen their grip on her in standing, not able to relate that she will fall due to balance deficits.               Pertinent Vitals/Pain Pain Assessment: Faces Faces Pain Scale: Hurts a little bit Pain Location: bil shoulders Pain Descriptors / Indicators: Grimacing;Guarding Pain Intervention(s): Limited activity within patient's tolerance;Monitored during session;Repositioned           PT Goals (current goals can now be found in the care plan section) Acute Rehab PT Goals Patient Stated Goal: pt wants many things scratched because she is itchy.  Progress towards PT goals: Progressing toward goals    Frequency    Min 5X/week      PT Plan Current plan remains appropriate       AM-PAC PT "6 Clicks" Daily Activity  Outcome Measure  Difficulty turning over in bed (including adjusting bedclothes, sheets and blankets)?: Total Difficulty moving from lying on back to sitting on the side of the bed? : Total Difficulty sitting down on and standing up from a chair with arms (e.g., wheelchair, bedside commode, etc,.)?: Total Help needed moving to and from a bed to chair (including a wheelchair)?: A Lot Help needed walking in hospital room?: A Lot Help needed climbing 3-5 steps with a railing? : Total 6 Click Score: 8    End of Session Equipment Utilized During Treatment: Gait belt;Other (comment) (bil shoulder slings) Activity Tolerance: Other (comment) (limited by imbalance in standing. ) Patient left: in chair;with call bell/phone within reach;with chair alarm set;with nursing/sitter in room (tele sitter)   PT Visit Diagnosis: Unsteadiness on feet (R26.81);Muscle weakness (generalized) (M62.81);Pain Pain - Right/Left: Right (bil) Pain - part of body: Shoulder (bil)     Time: [id number] PT Time Calculation (min) (ACUTE ONLY): 20 min  Charges:  $Therapeutic  Activity: 8-22 mins          [place] Howington B. Jeron Grahn, PT, DPT [phone]            05/06/2017, 12:23 PM

## 2017-05-07 LAB — CBC
HEMATOCRIT: 37.7 % (ref 36.0–46.0)
HEMOGLOBIN: 12.7 g/dL (ref 12.0–15.0)
MCH: 29.1 pg (ref 26.0–34.0)
MCHC: 33.7 g/dL (ref 30.0–36.0)
MCV: 86.5 fL (ref 78.0–100.0)
Platelets: 298 10*3/uL (ref 150–400)
RBC: 4.36 MIL/uL (ref 3.87–5.11)
RDW: 12.9 % (ref 11.5–15.5)
WBC: 10.3 10*3/uL (ref 4.0–10.5)

## 2017-05-07 LAB — BASIC METABOLIC PANEL
Anion gap: 9 (ref 5–15)
BUN: 11 mg/dL (ref 6–20)
CHLORIDE: 110 mmol/L (ref 101–111)
CO2: 21 mmol/L — AB (ref 22–32)
Calcium: 8.7 mg/dL — ABNORMAL LOW (ref 8.9–10.3)
Creatinine, Ser: 0.74 mg/dL (ref 0.44–1.00)
GFR calc non Af Amer: >60 mL/min (ref >60)
Glucose, Bld: 128 mg/dL — ABNORMAL HIGH (ref 65–99)
POTASSIUM: 3.6 mmol/L (ref 3.5–5.1)
Sodium: 140 mmol/L (ref 135–145)

## 2017-05-07 MED ORDER — HALOPERIDOL LACTATE 5 MG/ML IJ SOLN
2.0000 mg | Freq: Once | INTRAMUSCULAR | Status: AC
  Administered 2017-05-07: 2 mg via INTRAVENOUS
  Filled 2017-05-07: qty 1

## 2017-05-07 NOTE — Care Management Note (Signed)
Case Management Note  Patient Details  Name: Kristin Conrad MRN: [id number] Date of Birth: 1930/11/19  Subjective/Objective:                    Action/Plan:   Expected Discharge Date:  05/07/17               Expected Discharge Plan:  Skilled Nursing Facility  In-House Referral:  Clinical Social Work  Discharge planning Services     Post Acute Care Choice:    Choice offered to:     DME Arranged:    DME Agency:     HH Arranged:    HH Agency:     Status of Service:  In process, will continue to follow  If discussed at Long Length of Stay Meetings, dates discussed:    Additional Comments:  Kingsley PlanWile, Lenell Mcconnell Marie, RN 05/07/2017, 10:33 AM

## 2017-05-07 NOTE — Progress Notes (Signed)
Physical Therapy Treatment Patient Details Name: Kristin Conrad MRN: [id number] DOB: [birth year] Today's Date: 05/07/2017    History of Present Illness Olayinka was outside [employer] yesterday when she began to fall. She held on to the shopping cart with both hands as she began to fall and dislocated both shoulders. She did not lose consciousness. She was brought to [facility] and her dislocations were reduced by the EDP. She was admitted by the IM service. She lives alone in [place], [place] and was here visiting her daughter. Pt is currently delirious (possibly from UTI) and unable to contribute to history which was gleaned from chart and provided by daughter. PMH: COPD, left foot drop, TIA, afib, peripheral neuropathy and previous back and hip surgery and TKA right.   MRI negative for acute events in the brain, cervical spine does have some  Severe neural foraminal narrowing C3-4 thru C6-7    PT Comments    Pt performed increased activity as evident by increased gait and standing trials.  Pt remains agitated but easier to redirect during session.  Pt focused on putting on her pants before she can walk.  Pt fatigues quickly.  Appears lethargic and loopy due to meds during session.  Daughter is please with her mobility but concerned with her cognition.  Plan for short term SNF placement remains appropriate.     Follow Up Recommendations  SNF;Supervision - Intermittent     Equipment Recommendations  None recommended by PT    Recommendations for Other Services       Precautions / Restrictions Precautions Precautions: Fall Precaution Comments: Forward flexed posturing during session.   Required Braces or Orthoses: Sling Restrictions Weight Bearing Restrictions: Yes RUE Weight Bearing: Non weight bearing LUE Weight Bearing: Non weight bearing Other Position/Activity Restrictions: NWB and slings at all times x 1 wk.  Gentle pendulums     Mobility  Bed Mobility                General bed mobility comments: Pt up in chair, slumped to bottom with recliner reclined.    Transfers Overall transfer level: Needs assistance Equipment used: None Transfers: Sit to/from Stand Sit to Stand: Mod assist;+2 physical assistance         General transfer comment: Pt performed sit to stand x 3 reps during session.  Pt required cues for foot placement, assist and facilitation of forwadr weight shifting to improve ease and translation into standing.  Facilitation for hip and upper trunk extension.  When returning to sit, patient pushing pelvis forward and required assist to flex B hips to return to a seated position.    Ambulation/Gait Ambulation/Gait assistance: Mod assist;+2 physical assistance Ambulation Distance (Feet): 24 Feet Assistive device: None Gait Pattern/deviations: Step-through pattern;Decreased stride length;Shuffle;Trunk flexed;Decreased dorsiflexion - left   Gait velocity interpretation: Below normal speed for age/gender General Gait Details: L foot drop during gait.  Cues for forward advancement of B LEs to progress gait.  Pt is fatigued quickly during session.  Daughter pleased with patient's progress.     Stairs            Wheelchair Mobility    Modified Rankin (Stroke Patients Only)       Balance Overall balance assessment: Needs assistance   Sitting balance-Leahy Scale: Poor Sitting balance - Comments: Pt with poor ability to maintain sitting and trunk control.  Unclear if this is due to medication or due to coordination deficits and trunk control.       Standing balance-Leahy  Scale: Poor Standing balance comment: Pt with forward flexion, once able to get erect she remains to present with posterior lean.                              Cognition Arousal/Alertness: Awake/alert Behavior During Therapy: WFL for tasks assessed/performed Overall Cognitive Status: Impaired/Different from baseline Area of Impairment:  Orientation;Attention;Memory;Following commands;Safety/judgement;Awareness;Problem solving                 Orientation Level: Disoriented to;Place;Time;Situation (Pt reports she does not know who her daughter is but later during tx she is able to recall.  ) Current Attention Level: Sustained Memory: Decreased recall of precautions;Decreased short-term memory Following Commands: Follows one step commands consistently Safety/Judgement: Decreased awareness of safety;Decreased awareness of deficits Awareness: Intellectual Problem Solving: Difficulty sequencing;Decreased initiation;Slow processing;Requires verbal cues;Requires tactile cues General Comments: Pt is less irratable but more confused due to ativan.  Pt with tangential conversation.        Exercises      General Comments        Pertinent Vitals/Pain Pain Assessment: Faces Faces Pain Scale: Hurts a little bit Pain Location: bil shoulders Pain Descriptors / Indicators: Grimacing;Guarding Pain Intervention(s): Monitored during session    Home Living                      Prior Function            PT Goals (current goals can now be found in the care plan section) Acute Rehab PT Goals Patient Stated Goal: To put on her pants and comb her hair.   Potential to Achieve Goals: Good Progress towards PT goals: Progressing toward goals    Frequency    Min 5X/week      PT Plan Current plan remains appropriate    Co-evaluation              AM-PAC PT "6 Clicks" Daily Activity  Outcome Measure  Difficulty turning over in bed (including adjusting bedclothes, sheets and blankets)?: Total Difficulty moving from lying on back to sitting on the side of the bed? : Total Difficulty sitting down on and standing up from a chair with arms (e.g., wheelchair, bedside commode, etc,.)?: Total Help needed moving to and from a bed to chair (including a wheelchair)?: A Lot Help needed walking in hospital room?: A  Lot Help needed climbing 3-5 steps with a railing? : Total 6 Click Score: 8    End of Session Equipment Utilized During Treatment: Gait belt (B shoulder slings) Activity Tolerance: Patient limited by fatigue;Patient limited by lethargy;Patient tolerated treatment well Patient left: in chair;with family/visitor present Nurse Communication: Mobility status PT Visit Diagnosis: Unsteadiness on feet (R26.81);Muscle weakness (generalized) (M62.81);Pain Pain - Right/Left: Right (Bilateral) Pain - part of body: Shoulder (Bilateral)     Time: [id number] PT Time Calculation (min) (ACUTE ONLY): 32 min  Charges:  $Gait Training: 8-22 mins $Therapeutic Activity: 8-22 mins                    G Codes:       Joycelyn Ruaimee Markesha Hannig, PTA pager [phone]    Florestine AversAimee J Caron Tardif 05/07/2017, 2:13 PM
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
## 2017-05-07 NOTE — NC FL2 (Signed)
MEDICAID FL2 LEVEL OF CARE SCREENING TOOL     IDENTIFICATION  Patient Name: Kristin Conrad Birthdate: [birth year] Sex: female Admission Date (Current Location): 05/03/2017  [facility] and [place] Number:  Producer, television/film/video and Address:  [facility] [place], [address] [place], [place] [address]      Provider Number: [id number]  Attending Physician Name and Address:  Rodolph Bong, MD  Relative Name and Phone Number:       Current Level of Care: Hospital Recommended Level of Care: Skilled Nursing Facility Prior Approval Number:    Date Approved/Denied:   PASRR Number:    Discharge Plan: SNF    Current Diagnoses: Patient Active Problem List   Diagnosis Date Noted  . Bilateral neuropathy of upper extremities   . Acute metabolic encephalopathy 05/04/2017  . Fall 05/03/2017  . Shoulder dislocation 05/03/2017  . Hypokalemia 05/03/2017  . COPD (chronic obstructive pulmonary disease) (HCC) 05/03/2017  . Acute lower UTI 05/03/2017  . Delirium 05/03/2017    Orientation RESPIRATION BLADDER Height & Weight     Self  Normal Incontinent Weight: 158 lb (71.7 kg) Height:  5\' 3"  (160 cm)  BEHAVIORAL SYMPTOMS/MOOD NEUROLOGICAL BOWEL NUTRITION STATUS      Continent Diet (Heart Healthy; Thin fluids)  AMBULATORY STATUS COMMUNICATION OF NEEDS Skin   Extensive Assist Verbally Normal                       Personal Care Assistance Level of Assistance  Bathing, Feeding, Dressing Bathing Assistance: Maximum assistance Feeding assistance: Maximum assistance Dressing Assistance: Maximum assistance     Functional Limitations Info  Sight, Hearing, Speech Sight Info: Adequate Hearing Info: Adequate Speech Info: Adequate    SPECIAL CARE FACTORS FREQUENCY  PT (By licensed PT), OT (By licensed OT)     PT Frequency: 5x OT Frequency: 5x            Contractures Contractures Info: Not present    Additional Factors Info  Code Status,  Allergies Code Status Info: DNR Allergies Info: Codeine, Penicillins, Sulfa Antibiotics           Current Medications (05/07/2017):  This is the current hospital active medication list Current Facility-Administered Medications  Medication Dose Route Frequency Provider Last Rate Last Dose  . acetaminophen (TYLENOL) tablet 650 mg  650 mg Oral Q6H PRN Michael Litter, MD       Or  . acetaminophen (TYLENOL) suppository 650 mg  650 mg Rectal Q6H PRN Michael Litter, MD      . amLODipine (NORVASC) tablet 5 mg  5 mg Oral Daily Rodolph Bong, MD   5 mg at 05/07/17 1019  . carvedilol (COREG) tablet 3.125 mg  3.125 mg Oral BID WC Rodolph Bong, MD   3.125 mg at 05/07/17 1020  . cefTRIAXone (ROCEPHIN) 1 g in dextrose 5 % 50 mL IVPB  1 g Intravenous QHS Michael Litter, MD   Stopped at 05/06/17 2144  . clopidogrel (PLAVIX) tablet 75 mg  75 mg Oral Once per day on Mon Wed Fri Michael Litter, MD   75 mg at 05/07/17 1019  . ipratropium-albuterol (DUONEB) 0.5-2.5 (3) MG/3ML nebulizer solution 3 mL  3 mL Nebulization Q4H PRN Michael Litter, MD      . ketorolac (TORADOL) 15 MG/ML injection 15 mg  15 mg Intravenous Q6H Rai, Ripudeep K, MD   15 mg at 05/07/17 0433  . labetalol (NORMODYNE,TRANDATE) injection 10 mg  10 mg  Intravenous Q4H PRN Michael Litterarter, Nikki, MD      . loratadine (CLARITIN) tablet 10 mg  10 mg Oral Daily Rodolph Bonghompson, Daniel V, MD   10 mg at 05/07/17 1018  . LORazepam (ATIVAN) injection 0.5 mg  0.5 mg Intravenous Q8H PRN Rai, Ripudeep K, MD   0.5 mg at 05/07/17 0355  . mirabegron ER (MYRBETRIQ) tablet 50 mg  50 mg Oral Daily Michael Litterarter, Nikki, MD   50 mg at 05/07/17 1019  . mometasone-formoterol (DULERA) 200-5 MCG/ACT inhaler 2 puff  2 puff Inhalation BID Rodolph Bonghompson, Daniel V, MD   2 puff at 05/06/17 2034  . ondansetron (ZOFRAN) tablet 4 mg  4 mg Oral Q6H PRN Michael Litterarter, Nikki, MD       Or  . ondansetron [facility](ZOFRAN) injection 4 mg  4 mg Intravenous Q6H PRN Michael Litterarter, Nikki, MD      . rosuvastatin (CRESTOR) tablet 10  mg  10 mg Oral Daily Michael Litterarter, Nikki, MD   10 mg at 05/07/17 1018  . sodium chloride flush (NS) 0.9 % injection 3 mL  3 mL Intravenous Q12H Michael Litterarter, Nikki, MD   3 mL at 05/07/17 1022     Discharge Medications: Please see discharge summary for a list of discharge medications.  Relevant Imaging Results:  Relevant Lab Results:   Additional Information SSN: [id number]  Dominic PeaJeneya G Partick Musselman, LCSW

## 2017-05-07 NOTE — Progress Notes (Signed)
Pt very restless and agitated, kept on trying to get up. Called on call Dr. Toniann FailKakrakandy, PRN IV ativan 0.5mg  q8hr not due until 0515 but MD instructed to go ahead give it at this time and resume IV Ativan 0.5mg Q 8HR PRN after.

## 2017-05-07 NOTE — Clinical Social Work Note (Signed)
Clinical Social Work Assessment  Patient Details  Name: Jesse Hirst MRN: [id number] Date of Birth: May 03, 1930  Date of referral:  05/07/17               Reason for consult:  Discharge Planning                Permission sought to share information with:  Family Supports Permission granted to share information::  Yes, Verbal Permission Granted  Name::     Janett Labella  Agency::  SNFs  Relationship::  Daughter  Contact Information:  [phone]  Housing/Transportation Living arrangements for the past 2 months:  [place] of Information:  Adult Children Patient Interpreter Needed:  None Criminal Activity/Legal Involvement Pertinent to Current Situation/Hospitalization:  No - Comment as needed Significant Relationships:  Adult Children Lives with:  Self Do you feel safe going back to the place where you live?  Yes Need for family participation in patient care:  Yes (Comment)  Care giving concerns:  No care giving concerns identified.    Social Worker assessment / plan:  CSW met with pt to address consult for new SNF. CSW introduced herself and explained role of social work. Dtr-Barbara Lassiter at bedside. Pt in and out of sleep. Pt from home alone in [place], [place]. Pt was visiting with dtr for Mother's Day and had a fall. P/T is recommending STR at SNF. CSW provided SNF listing for review. Pt agreeable to SNF for STR. Pt prefers [facility]. Dtr aware that pt will likely be discharged 5/19.   CSW will sent FL-2 to SNFs and provided bed offers to dtr and SIL-Ron Lassiter. Dtr interested in [facility]. Kristy at [facility] confirmed bed offer and can accept on Saturday. Dtr to tour [facility] and [employer] today. CSW will continue to follow.   Employment status:  Retired Forensic scientist:  Medicare PT Recommendations:  [place] / Referral to community resources:  [place]  Patient/Family's Response to care:  Unable to assess. Pt oriented x1. Dtr appreciative of social work support.   Patient/Family's Understanding of and Emotional Response to Diagnosis, Current Treatment, and Prognosis:  Unable to assess pt understanding. Dtr appears to understand diagnosis, prognosis, and current treatment. Dtr concerned about making the best choice for her pt's placement.   Emotional Assessment Appearance:  Appears stated age Attitude/Demeanor/Rapport:  Other (Appropriate) Affect (typically observed):  Quiet Orientation:  Oriented to Self Alcohol / Substance use:  Other Psych involvement (Current and /or in the community):  No (Comment)  Discharge Needs  Concerns to be addressed:  Care Coordination Readmission within the last 30 days:  No Current discharge risk:  Dependent with Mobility, Lives alone Barriers to Discharge:  Continued Medical Work up   [employer], LCSW 05/07/2017, 6:27 PM

## 2017-05-07 NOTE — Progress Notes (Signed)
Patient very confused and agitated, trying to get out of bed, reoriented to self, room and situation but remains confused and was looking for family. Ativan .5 mg IV given at 2022 with no effect, md on call notified with new order. Will continue to monitor.

## 2017-05-08 DIAGNOSIS — R451 Restlessness and agitation: Secondary | ICD-10-CM

## 2017-05-08 DIAGNOSIS — F05 Delirium due to known physiological condition: Secondary | ICD-10-CM

## 2017-05-08 MED ORDER — OLANZAPINE 5 MG PO TBDP
2.5000 mg | ORAL_TABLET | Freq: Once | ORAL | Status: AC
Start: 2017-05-08 — End: 2017-05-08
  Administered 2017-05-08: 2.5 mg via ORAL
  Filled 2017-05-08: qty 0.5

## 2017-05-08 MED ORDER — POTASSIUM CHLORIDE CRYS ER 20 MEQ PO TBCR
40.0000 meq | EXTENDED_RELEASE_TABLET | Freq: Once | ORAL | Status: AC
  Administered 2017-05-08: 40 meq via ORAL
  Filled 2017-05-08: qty 2

## 2017-05-08 MED ORDER — OLANZAPINE 5 MG PO TBDP
5.0000 mg | ORAL_TABLET | Freq: Every day | ORAL | Status: DC
  Administered 2017-05-08 – 2017-05-10 (×3): 5 mg via ORAL
  Filled 2017-05-08 (×3): qty 1
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
## 2017-05-09 LAB — BASIC METABOLIC PANEL
Anion gap: 10 (ref 5–15)
BUN: 22 mg/dL — ABNORMAL HIGH (ref 6–20)
CO2: 20 mmol/L — AB (ref 22–32)
CREATININE: 0.93 mg/dL (ref 0.44–1.00)
Calcium: 9.1 mg/dL (ref 8.9–10.3)
Chloride: 108 mmol/L (ref 101–111)
GFR calc non Af Amer: 55 mL/min — ABNORMAL LOW (ref >60)
Glucose, Bld: 162 mg/dL — ABNORMAL HIGH (ref 65–99)
POTASSIUM: 4 mmol/L (ref 3.5–5.1)
Sodium: 138 mmol/L (ref 135–145)

## 2017-05-09 MED ORDER — LACTULOSE 10 GM/15ML PO SOLN
30.0000 g | Freq: Two times a day (BID) | ORAL | Status: DC
  Administered 2017-05-09 – 2017-05-11 (×3): 30 g via ORAL
  Filled 2017-05-09 (×4): qty 45

## 2017-05-09 MED ORDER — HALOPERIDOL LACTATE 5 MG/ML IJ SOLN
5.0000 mg | Freq: Four times a day (QID) | INTRAMUSCULAR | Status: DC | PRN
  Administered 2017-05-10: 5 mg via INTRAVENOUS
  Filled 2017-05-09: qty 1

## 2017-05-09 MED ORDER — HALOPERIDOL LACTATE 5 MG/ML IJ SOLN
2.0000 mg | Freq: Once | INTRAMUSCULAR | Status: AC
  Administered 2017-05-09: 2 mg via INTRAVENOUS
  Filled 2017-05-09: qty 1
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
## 2017-05-09 NOTE — Plan of Care (Signed)
Problem: Education: Goal: Knowledge of [place] [facility] Education information/materials will improve Outcome: Not Progressing Patient confused, not at baseline.

## 2017-05-09 NOTE — Progress Notes (Signed)
Physical Therapy Treatment Patient Details Name: Kristin Conrad MRN: [id number] DOB: 08-03-1930 Today's Date: 05/09/2017    History of Present Illness Kristin Manislizabeth was outside [employer] yesterday when she began to fall. She held on to the shopping cart with both hands as she began to fall and dislocated both shoulders. She did not lose consciousness. She was brought to [facility] and her dislocations were reduced by the EDP. She was admitted by the IM service. [place] and was here visiting her daughter. Pt is currently delirious (possibly from UTI) and unable to contribute to history which was gleaned from chart and provided by daughter. PMH: COPD, left foot drop, TIA, afib, peripheral neuropathy and previous back and hip surgery and TKA right.   MRI negative for acute events in the brain, cervical spine does have some  Severe neural foraminal narrowing C3-4 thru C6-7    PT Comments    Pt very lethargic and confused, suspect due to ativan. Unable to safely ambulate this session. Pt assisted OOB and into recliner. Chair alarm in place and family present in room. PT to progress mobility as pt tolerates.   Follow Up Recommendations  SNF;Supervision - Intermittent     Equipment Recommendations  None recommended by PT    Recommendations for Other Services       Precautions / Restrictions Precautions Precautions: Fall Required Braces or Orthoses: Sling Restrictions RUE Weight Bearing: Non weight bearing LUE Weight Bearing: Non weight bearing Other Position/Activity Restrictions: NWB and slings at all times x 1 wk.  Gentle pendulums     Mobility  Bed Mobility         Supine to sit: HOB elevated;Mod assist     General bed mobility comments: increased time, verbal cues to maintain NWB BUE  Transfers   Equipment used: None   Sit to Stand: +2 physical assistance;Mod assist Stand pivot transfers: Mod assist;+2 safety/equipment       General transfer  comment: Retropulsive on initial stance. Pt very unsteady and lethargic.  Ambulation/Gait             General Gait Details: Unable to ambulate this session due to lethargy from ativan.   Stairs            Wheelchair Mobility    Modified Rankin (Stroke Patients Only)       Balance   Sitting-balance support: No upper extremity supported;Feet unsupported Sitting balance-Leahy Scale: Fair Sitting balance - Comments: occassional min assist to maintain EOB sitting Postural control: Posterior lean Standing balance support: During functional activity;No upper extremity supported Standing balance-Leahy Scale: Poor Standing balance comment: +2 mod assist to maintain stance                            Cognition Arousal/Alertness: Lethargic;Suspect due to medications (Pt given ativan at 0100.) Behavior During Therapy: [facility] for tasks assessed/performed Overall Cognitive Status: Impaired/Different from baseline Area of Impairment: Orientation;Attention;Memory;Following commands;Safety/judgement;Awareness;Problem solving                 Orientation Level: Disoriented to;Place;Time;Situation Current Attention Level: Sustained Memory: Decreased recall of precautions;Decreased short-term memory Following Commands: Follows one step commands inconsistently;Follows one step commands with increased time Safety/Judgement: Decreased awareness of safety;Decreased awareness of deficits Awareness: Intellectual Problem Solving: Difficulty sequencing;Decreased initiation;Slow processing;Requires verbal cues;Requires tactile cues General Comments: Pt lethargic and confused due to ativan. Difficulty staying awake.      Exercises  General Comments General comments (skin integrity, edema, etc.): Donned R wrist splint at beginning of session.      Pertinent Vitals/Pain Faces Pain Scale: Hurts a little bit Pain Location: bil shoulders Pain Descriptors / Indicators:  Grimacing;Guarding Pain Intervention(s): Repositioned;Monitored during session    Home Living                      Prior Function            PT Goals (current goals can now be found in the care plan section) Acute Rehab PT Goals Patient Stated Goal: not stated PT Goal Formulation: With patient/family Time For Goal Achievement: 05/18/17 Potential to Achieve Goals: Good Progress towards PT goals: Progressing toward goals (slowly)    Frequency    Min 5X/week      PT Plan Current plan remains appropriate    Co-evaluation              AM-PAC PT "6 Clicks" Daily Activity  Outcome Measure  Difficulty turning over in bed (including adjusting bedclothes, sheets and blankets)?: Total Difficulty moving from lying on back to sitting on the side of the bed? : Total Difficulty sitting down on and standing up from a chair with arms (e.g., wheelchair, bedside commode, etc,.)?: Total Help needed moving to and from a bed to chair (including a wheelchair)?: A Lot Help needed walking in hospital room?: A Lot Help needed climbing 3-5 steps with a railing? : Total 6 Click Score: 8    End of Session Equipment Utilized During Treatment: Gait belt Activity Tolerance: Patient limited by lethargy Patient left: in chair;with chair alarm set;with family/visitor present;with call bell/phone within reach Nurse Communication: Mobility status PT Visit Diagnosis: Unsteadiness on feet (R26.81);Muscle weakness (generalized) (M62.81);Pain Pain - Right/Left: Right Pain - part of body: Shoulder     Time: 6962-9528 PT Time Calculation (min) (ACUTE ONLY): 15 min  Charges:  $Therapeutic Activity: 8-22 mins                    G Codes:       Aida Raider, PT  Office # [phone] Pager [phone]    Ilda Foil 05/09/2017, 1:03 PM

## 2017-05-09 NOTE — Progress Notes (Signed)
Kristin Conrad woke up very confused and agitated looking for her family, attempting to get out of bed, reoriented and redirected her but continues to get out of bed and yelling for help. Ativan 0.5 mg IV given with no effect, on call md notified with new order.

## 2017-05-10 ENCOUNTER — Inpatient Hospital Stay (HOSPITAL_COMMUNITY): Payer: Medicare Other

## 2017-05-10 DIAGNOSIS — D72829 Elevated white blood cell count, unspecified: Secondary | ICD-10-CM

## 2017-05-10 LAB — CBC WITH DIFFERENTIAL/PLATELET
Basophils Absolute: 0 K/uL (ref 0.0–0.1)
Basophils Relative: 0 %
Eosinophils Absolute: 1 K/uL — ABNORMAL HIGH (ref 0.0–0.7)
Eosinophils Relative: 8 %
HCT: 40.3 % (ref 36.0–46.0)
Hemoglobin: 13.6 g/dL (ref 12.0–15.0)
Lymphocytes Relative: 26 %
Lymphs Abs: 3.1 K/uL (ref 0.7–4.0)
MCH: 29.5 pg (ref 26.0–34.0)
MCHC: 33.7 g/dL (ref 30.0–36.0)
MCV: 87.4 fL (ref 78.0–100.0)
Monocytes Absolute: 1 K/uL (ref 0.1–1.0)
Monocytes Relative: 8 %
Neutro Abs: 7.2 K/uL (ref 1.7–7.7)
Neutrophils Relative %: 58 %
Platelets: 355 K/uL (ref 150–400)
RBC: 4.61 MIL/uL (ref 3.87–5.11)
RDW: 13.1 % (ref 11.5–15.5)
WBC: 12.3 K/uL — ABNORMAL HIGH (ref 4.0–10.5)

## 2017-05-10 LAB — URINALYSIS, ROUTINE W REFLEX MICROSCOPIC
Bilirubin Urine: NEGATIVE
Glucose, UA: NEGATIVE mg/dL
Hgb urine dipstick: NEGATIVE
Ketones, ur: NEGATIVE mg/dL
Leukocytes, UA: NEGATIVE
Nitrite: NEGATIVE
Protein, ur: NEGATIVE mg/dL
Specific Gravity, Urine: 1.015 (ref 1.005–1.030)
pH: 5 (ref 5.0–8.0)

## 2017-05-10 LAB — BASIC METABOLIC PANEL
Anion gap: 12 (ref 5–15)
BUN: 18 mg/dL (ref 6–20)
CO2: 20 mmol/L — AB (ref 22–32)
CREATININE: 0.75 mg/dL (ref 0.44–1.00)
Calcium: 9 mg/dL (ref 8.9–10.3)
Chloride: 108 mmol/L (ref 101–111)
GFR calc non Af Amer: >60 mL/min (ref >60)
Glucose, Bld: 131 mg/dL — ABNORMAL HIGH (ref 65–99)
Potassium: 3.8 mmol/L (ref 3.5–5.1)
Sodium: 140 mmol/L (ref 135–145)

## 2017-05-10 LAB — MAGNESIUM: Magnesium: 2 mg/dL (ref 1.7–2.4)

## 2017-05-10 MED ORDER — SORBITOL 70 % SOLN
960.0000 mL | TOPICAL_OIL | Freq: Once | ORAL | Status: AC
  Administered 2017-05-10: 960 mL via RECTAL
  Filled 2017-05-10: qty 240

## 2017-05-10 NOTE — Progress Notes (Signed)
Occupational Therapy Treatment Patient Details Name: Kristin Conrad MRN: [id number] DOB: [birth year] Today's Date: 05/10/2017    History of present illness Kristin Conrad was outside [employer] when she began to fall. She held on to the shopping cart with both hands as she began to fall and dislocated both shoulders. She did not lose consciousness. She was brought to [facility] and her dislocations were reduced by the EDP. She was admitted by the IM service. She lives alone in [place], [place] and was here visiting her daughter. Kristin Conrad is currently delirious (possibly from UTI) and unable to contribute to history which was gleaned from chart and provided by daughter. PMH: COPD, left foot drop, TIA, afib, peripheral neuropathy and previous back and hip surgery and TKA right.   MRI negative for acute events in the brain, cervical spine does have some  Severe neural foraminal narrowing C3-4 thru C6-7   OT comments  Kristin Conrad continues to demonstrate increased confusion throughout session and required Max VCs for completing tasks. Educated Kristin Conrad and daughter on proper sling positioning. Kristin Conrad required Mod A + 2 for functional mobility and declined to perform ADLs due to agitation and confusion. Will continue to follow acutely. Continue to recommend dc to SNF.    Follow Up Recommendations  SNF;Supervision/Assistance - 24 hour    Equipment Recommendations  3 in 1 bedside commode;Tub/shower seat;Hospital bed    Recommendations for Other Services      Precautions / Restrictions Precautions Precautions: Fall Precaution Comments: Forward flexed posturing and posterior lean during session. Required Braces or Orthoses: Sling Restrictions Weight Bearing Restrictions: Yes RUE Weight Bearing: Non weight bearing LUE Weight Bearing: Non weight bearing Other Position/Activity Restrictions: NWB and slings at all times x 1 wk.  Gentle pendulums        Mobility Bed Mobility Overal bed mobility: Needs Assistance Bed Mobility:  Supine to Sit;Sit to Supine     Supine to sit: Max assist;+2 for physical assistance Sit to supine: Total assist;+2 for physical assistance   General bed mobility comments: increased time, verbal cues to maintain NWB BUE, assist for trunk elevation and decline to return to supine position.    Transfers Overall transfer level: Needs assistance Equipment used: None Transfers: Sit to/from Stand Sit to Stand: +2 physical assistance;Mod assist Stand pivot transfers: Mod assist;+2 safety/equipment       General transfer comment: Kristin Conrad remains to present with posterior lean which turns to flexed upper trunk with cues for hip and trunk extension.      Balance Overall balance assessment: Needs assistance Sitting-balance support: No upper extremity supported;Feet unsupported Sitting balance-Leahy Scale: Fair Sitting balance - Comments: occassional min assist to maintain EOB sitting Postural control: Posterior lean Standing balance support: During functional activity;No upper extremity supported Standing balance-Leahy Scale: Poor Standing balance comment: +2 mod assist to maintain stance                           ADL either performed or assessed with clinical judgement   ADL Overall ADL's : Needs assistance/impaired     Grooming: Total assistance           Upper Body Dressing : Total assistance Upper Body Dressing Details (indicate cue type and reason): Educated Kristin Conrad and daughter on proper sling positioning                 Functional mobility during ADLs: Moderate assistance;+2 for physical assistance;+2 for safety/equipment General ADL Comments: Kristin Conrad is unable to participate  in functional tasks at this time due to confusion and agitation. Performed functional mobility  and educated daughter on proper sling positioning.      Vision       Perception     Praxis      Cognition Arousal/Alertness: Awake/alert Behavior During Therapy: WFL for tasks  assessed/performed Overall Cognitive Status: Impaired/Different from baseline Area of Impairment: Orientation;Attention;Memory;Following commands;Safety/judgement;Awareness;Problem solving                 Orientation Level: Disoriented to;Place;Time;Situation Current Attention Level: Focused Memory: Decreased recall of precautions;Decreased short-term memory Following Commands: Follows one step commands inconsistently;Follows one step commands with increased time Safety/Judgement: Decreased awareness of safety;Decreased awareness of deficits Awareness: Intellectual Problem Solving: Difficulty sequencing;Decreased initiation;Slow processing;Requires verbal cues;Requires tactile cues General Comments: Throughout session, Kristin Conrad was demonstrating confusion and making remark that did not relate to tasks. Kristin Conrad wanting to get back up once she had completed tx.          Exercises     Shoulder Instructions       General Comments Reviewed slint wearing schedule    Pertinent Vitals/ Pain       Pain Assessment: Faces Faces Pain Scale: Hurts a little bit Pain Location: bil shoulders Pain Descriptors / Indicators: Grimacing;Guarding Pain Intervention(s): Monitored during session  Home Living                                          Prior Functioning/Environment              Frequency  Min 2X/week        Progress Toward Goals  OT Goals(current goals can now be found in the care plan section)  Progress towards OT goals: Progressing toward goals  Acute Rehab OT Goals Patient Stated Goal: not stated OT Goal Formulation: With patient/family Time For Goal Achievement: 05/11/17 Potential to Achieve Goals: Good ADL Goals Kristin Conrad Will Transfer to Toilet: with min assist;stand pivot transfer;bedside commode Additional ADL Goal #1: Kristin Conrad will be independent with precautions  Additional ADL Goal #2: Kristin Conrad will be perform HEP with mod A  from family/staff - pendulums, wrist,  hand ROM   Plan Discharge plan remains appropriate    Co-evaluation      Reason for Co-Treatment: Complexity of the patient's impairments (multi-system involvement);For patient/therapist safety;To address functional/ADL transfers Kristin Conrad goals addressed during session: Mobility/safety with mobility OT goals addressed during session: ADL's and self-care      AM-PAC Kristin Conrad "6 Clicks" Daily Activity     Outcome Measure   Help from another person eating meals?: Total Help from another person taking care of personal grooming?: Total Help from another person toileting, which includes using toliet, bedpan, or urinal?: Total Help from another person bathing (including washing, rinsing, drying)?: Total Help from another person to put on and taking off regular upper body clothing?: Total Help from another person to put on and taking off regular lower body clothing?: Total 6 Click Score: 6    End of Session Equipment Utilized During Treatment: Gait belt;Other (comment) (bil. slings )  OT Visit Diagnosis: Cognitive communication deficit (R41.841);Unsteadiness on feet (R26.81)   Activity Tolerance Patient limited by fatigue (Limited by confusion)   Patient Left with call bell/phone within reach;with family/visitor present;with bed alarm set;in bed   Nurse Communication Mobility status        Time: [id number] OT Time Calculation (min):  23 min  Charges: OT General Charges $OT Visit: 1 Procedure OT Treatments $Therapeutic Activity: 8-22 mins  Nelson Julson, OTR/L [phone]   Theodoro Grist Okey Zelek 05/10/2017, 5:15 PM

## 2017-05-10 NOTE — Progress Notes (Signed)
Received call from MD Janee Mornhompson asking why urine sample had not been sent, I explained [facility] but got [facility]. The times she did go it was mixed with stool in bedpan. He ordered for once I&O cath to get urine sample.

## 2017-05-10 NOTE — Care Management Note (Signed)
Case Management Note  Patient Details  Name: Derrill Centerlizabeth Deasis MRN: [id number] Date of Birth: 1930-01-14  Subjective/Objective:                    Action/Plan:   Expected Discharge Date:  05/07/17               Expected Discharge Plan:  Skilled Nursing Facility  In-House Referral:  Clinical Social Work  Discharge planning Services     Post Acute Care Choice:    Choice offered to:     DME Arranged:    DME Agency:     HH Arranged:    HH Agency:     Status of Service:  In process, will continue to follow  If discussed at Long Length of Stay Meetings, dates discussed:    Additional Comments:  Kingsley PlanWile, Garon Melander Marie, RN 05/10/2017, 11:34 AM

## 2017-05-10 NOTE — Progress Notes (Signed)
Physical Therapy Treatment Patient Details Name: Kristin Conrad MRN: [id number] DOB: 01-12-1930 Today's Date: 05/10/2017    History of Present Illness Kristin Conrad was outside [employer] yesterday when she began to fall. She held on to the shopping cart with both hands as she began to fall and dislocated both shoulders. She did not lose consciousness. She was [facility] and her dislocations were reduced by the EDP. She was admitted by the IM service. She lives alone in [place], [place] and was here visiting her daughter. Pt is currently delirious (possibly from UTI) and unable to contribute to history which was gleaned from chart and provided by daughter. PMH: COPD, left foot drop, TIA, afib, peripheral neuropathy and previous back and hip surgery and TKA right.   MRI negative for acute events in the brain, cervical spine does have some  Severe neural foraminal narrowing C3-4 thru C6-7    PT Comments    Pt performed increased gait but required increased assistance due to cognition and quickly fatiguing with activity.  Pt remains appropriate for short term SNF placement before d/c home.    Follow Up Recommendations  SNF;Supervision - Intermittent     Equipment Recommendations  None recommended by PT    Recommendations for Other Services       Precautions / Restrictions Precautions Precautions: Fall Precaution Comments: Forward flexed posturing during session.   Required Braces or Orthoses: Sling Restrictions Weight Bearing Restrictions: Yes RUE Weight Bearing: Non weight bearing LUE Weight Bearing: Non weight bearing Other Position/Activity Restrictions: NWB and slings at all times x 1 wk.  Gentle pendulums     Mobility  Bed Mobility Overal bed mobility: Needs Assistance Bed Mobility: Supine to Sit;Sit to Supine     Supine to sit: Max assist;+2 for physical assistance Sit to supine: Total assist;+2 for physical assistance   General bed mobility comments: increased time,  verbal cues to maintain NWB BUE, assist for trunk elevation and decline to return to supine position.    Transfers Overall transfer level: Needs assistance Equipment used: None Transfers: Sit to/from Stand Sit to Stand: +2 physical assistance;Mod assist Stand pivot transfers: Mod assist;+2 safety/equipment       General transfer comment: Pt remains to present with posterior lean which turns to flexed upper trunk with cues for hip and trunk extension.    Ambulation/Gait Ambulation/Gait assistance: Mod assist;+2 physical assistance;Max assist (assist level varried based on patient cognition.  ) Ambulation Distance (Feet): 35 Feet Assistive device: None Gait Pattern/deviations: Step-through pattern;Decreased stride length;Shuffle;Trunk flexed;Decreased dorsiflexion - left   Gait velocity interpretation: Below normal speed for age/gender General Gait Details: Pt able to ambulate but as gait progressed she required max cues for encourgament and max assist to correct upper trunk control and hip extension.     Stairs            Wheelchair Mobility    Modified Rankin (Stroke Patients Only)       Balance Overall balance assessment: Needs assistance Sitting-balance support: No upper extremity supported;Feet unsupported Sitting balance-Leahy Scale: Fair       Standing balance-Leahy Scale: Poor                              Cognition Arousal/Alertness: Awake/alert Behavior During Therapy: WFL for tasks assessed/performed Overall Cognitive Status: Impaired/Different from baseline Area of Impairment: Orientation;Attention;Memory;Following commands;Safety/judgement;Awareness;Problem solving  Orientation Level: Disoriented to;Place;Time;Situation Current Attention Level: Sustained Memory: Decreased recall of precautions;Decreased short-term memory Following Commands: Follows one step commands inconsistently;Follows one step commands with  increased time Safety/Judgement: Decreased awareness of safety;Decreased awareness of deficits Awareness: Intellectual Problem Solving: Difficulty sequencing;Decreased initiation;Slow processing;Requires verbal cues;Requires tactile cues General Comments: Pt confused and wanting to get back up once she had completed tx.        Exercises      General Comments        Pertinent Vitals/Pain Pain Assessment: Faces Faces Pain Scale: Hurts a little bit Pain Location: bil shoulders Pain Descriptors / Indicators: Grimacing;Guarding Pain Intervention(s): Monitored during session;Repositioned    Home Living                      Prior Function            PT Goals (current goals can now be found in the care plan section) Acute Rehab PT Goals Potential to Achieve Goals: Good Progress towards PT goals: Progressing toward goals    Frequency    Min 5X/week      PT Plan Current plan remains appropriate    Co-evaluation PT/OT/SLP Co-Evaluation/Treatment: Yes Reason for Co-Treatment: Complexity of the patient's impairments (multi-system involvement) PT goals addressed during session: Mobility/safety with mobility OT goals addressed during session: ADL's and self-care      AM-PAC PT "6 Clicks" Daily Activity  Outcome Measure  Difficulty turning over in bed (including adjusting bedclothes, sheets and blankets)?: Total Difficulty moving from lying on back to sitting on the side of the bed? : Total Difficulty sitting down on and standing up from a chair with arms (e.g., wheelchair, bedside commode, etc,.)?: Total Help needed moving to and from a bed to chair (including a wheelchair)?: A Lot Help needed walking in hospital room?: A Lot Help needed climbing 3-5 steps with a railing? : Total 6 Click Score: 8    End of Session Equipment Utilized During Treatment: Gait belt Activity Tolerance: Patient limited by lethargy Patient left: with family/visitor present;with call  bell/phone within reach;in bed;with bed alarm set Nurse Communication: Mobility status PT Visit Diagnosis: Unsteadiness on feet (R26.81);Muscle weakness (generalized) (M62.81);Pain Pain - Right/Left: Right Pain - part of body: Shoulder     Time: 4098-1191 PT Time Calculation (min) (ACUTE ONLY): 23 min  Charges:  $Gait Training: 8-22 mins                    G Codes:       Joycelyn Rua, PTA pager [phone]    Florestine Avers 05/10/2017, 4:47 PM
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
## 2017-05-11 LAB — CBC
HCT: 39.1 % (ref 36.0–46.0)
Hemoglobin: 13.5 g/dL (ref 12.0–15.0)
MCH: 30.5 pg (ref 26.0–34.0)
MCHC: 34.5 g/dL (ref 30.0–36.0)
MCV: 88.5 fL (ref 78.0–100.0)
PLATELETS: 320 10*3/uL (ref 150–400)
RBC: 4.42 MIL/uL (ref 3.87–5.11)
RDW: 13.3 % (ref 11.5–15.5)
WBC: 10.6 10*3/uL — AB (ref 4.0–10.5)

## 2017-05-11 LAB — BASIC METABOLIC PANEL WITH GFR
Anion gap: 8 (ref 5–15)
BUN: 15 mg/dL (ref 6–20)
CO2: 21 mmol/L — ABNORMAL LOW (ref 22–32)
Calcium: 8.9 mg/dL (ref 8.9–10.3)
Chloride: 109 mmol/L (ref 101–111)
Creatinine, Ser: 0.86 mg/dL (ref 0.44–1.00)
GFR calc Af Amer: >60 mL/min (ref >60)
GFR calc non Af Amer: >60 mL/min (ref >60)
Glucose, Bld: 121 mg/dL — ABNORMAL HIGH (ref 65–99)
Potassium: 3.6 mmol/L (ref 3.5–5.1)
Sodium: 138 mmol/L (ref 135–145)

## 2017-05-11 LAB — URINE CULTURE: Culture: NO GROWTH

## 2017-05-11 MED ORDER — OLANZAPINE 5 MG PO TBDP
5.0000 mg | ORAL_TABLET | Freq: Every day | ORAL | Status: DC
  Filled 2017-05-11: qty 1

## 2017-05-11 MED ORDER — CARVEDILOL 3.125 MG PO TABS: 3.1250 mg | ORAL_TABLET | Freq: Two times a day (BID) | ORAL | 0 refills | Status: AC

## 2017-05-11 MED ORDER — OLANZAPINE 5 MG PO TBDP
5.0000 mg | ORAL_TABLET | Freq: Two times a day (BID) | ORAL | Status: DC
  Filled 2017-05-11: qty 1

## 2017-05-11 MED ORDER — ACETAMINOPHEN 500 MG PO TABS
1000.0000 mg | ORAL_TABLET | Freq: Three times a day (TID) | ORAL | 0 refills | Status: AC
Start: 2017-05-11 — End: ?

## 2017-05-11 MED ORDER — OLANZAPINE 5 MG PO TBDP: 5.0000 mg | ORAL_TABLET | Freq: Every day | ORAL | 0 refills | Status: AC

## 2017-05-11 MED ORDER — HYDROCODONE-ACETAMINOPHEN 7.5-325 MG/15ML PO SOLN: 10.0000 mL | Freq: Every day | ORAL | Status: DC

## 2017-05-11 MED ORDER — AMLODIPINE BESYLATE 5 MG PO TABS: 5.0000 mg | ORAL_TABLET | Freq: Every day | ORAL | 0 refills | Status: AC

## 2017-05-11 MED ORDER — ACETAMINOPHEN 500 MG PO TABS: 1000.0000 mg | ORAL_TABLET | Freq: Three times a day (TID) | ORAL | Status: DC

## 2017-05-11 MED ORDER — POLYETHYLENE GLYCOL 3350 17 G PO PACK: 17.0000 g | PACK | Freq: Every day | ORAL | 0 refills | Status: AC

## 2017-05-11 MED ORDER — HYDROCODONE-ACETAMINOPHEN 7.5-325 MG/15ML PO SOLN: 10.0000 mL | Freq: Every day | ORAL | 0 refills | Status: AC

## 2017-05-11 NOTE — Clinical Social Work Placement (Addendum)
   CLINICAL SOCIAL WORK PLACEMENT  NOTE 05/10/17 - DISCHARGED TO [facility] VIA AMBULANCE  Date:  05/11/2017  Patient Details  Name: Kristin Conrad MRN: [id number] Date of Birth: Apr 30, 1930  Clinical Social Work is seeking post-discharge placement for this patient at the Skilled  Nursing Facility level of care (*CSW will initial, date and re-position this form in  chart as items are completed):  No   Patient/family provided with [facility] Clinical Social Work Department's list of facilities offering this level of care within the geographic area requested by the patient (or if unable, by the patient's family).  Yes   Patient/family informed of their freedom to choose among providers that offer the needed level of care, that participate in Medicare, Medicaid or managed care program needed by the patient, have an available bed and are willing to accept the patient.  No   Patient/family informed of [facility]'s ownership interest in [facility] and [facility], as well as of the fact that they are under no obligation to receive care at these facilities.  PASRR submitted to EDS on       PASRR number received on       Existing PASRR number confirmed on 05/11/17     FL2 transmitted to all facilities in geographic area requested by pt/family on 05/07/17     FL2 transmitted to all facilities within larger geographic area on       Patient informed that his/her managed care company has contracts with or will negotiate with certain facilities, including the following:        Yes   Patient/family informed of bed offers received.  Patient chooses bed at  [facility]     Physician recommends and patient chooses bed at      Patient to be transferred to  [facility] on 05/11/17.  Patient to be transferred to facility by Ambulance     Patient family notified on 05/11/17 of transfer.  Name of family member notified:  Ronnell GuadalajaraBarbara Lassiter, daughter. Daughter aware that  patient would be transported by ambulance. Call made to daughter at 6:06 pm to advise her that transport was called, however unsure of ETA.    PHYSICIAN       Additional Comment:    _______________________________________________ Cristobal Goldmannrawford, Taelyr Jantz Bradley, LCSW 05/11/2017, 6:03 PM

## 2017-05-11 NOTE — Progress Notes (Signed)
[facility] called [facility] [place] [facility].

## 2017-05-11 NOTE — Progress Notes (Signed)
Occupational Therapy Treatment Patient Details Name: Kristin Conrad MRN: [id number] DOB: 02-21-1930 Today's Date: 05/11/2017    History of present illness Kristin Conrad was outside [employer] when she began to fall. She held on to the shopping cart with both hands as she began to fall and dislocated both shoulders. She did not lose consciousness. She was brought to [facility] and her dislocations were reduced by the EDP. She was admitted by the IM service. She lives alone in [place], [place] and was here visiting her daughter. Pt is currently delirious (possibly from UTI) and unable to contribute to history which was gleaned from chart and provided by daughter. PMH: COPD, left foot drop, TIA, afib, peripheral neuropathy and previous back and hip surgery and TKA right.   MRI negative for acute events in the brain, cervical spine does have some  Severe neural foraminal narrowing C3-4 thru C6-7   OT comments  Pt making little to no progress due to her cognitive state.  Pt had no idea where she was or what happened today so unable to educate her re options of how to attempt some simple adls.  Pt also unable to be educated regarding pendulum exercises.  No family available at this time.  Pt very unsteady on her feet and will need 24/7 assist/supervision at d/c.  Follow Up Recommendations  SNF;Supervision/Assistance - 24 hour    Equipment Recommendations  3 in 1 bedside commode;Tub/shower seat;Hospital bed    Recommendations for Other Services      Precautions / Restrictions Precautions Precautions: Fall Precaution Comments: Forward flexed posturing and posterior lean during session. Required Braces or Orthoses: Sling Restrictions Weight Bearing Restrictions: Yes RUE Weight Bearing: Non weight bearing LUE Weight Bearing: Non weight bearing Other Position/Activity Restrictions: B slings adjusted as pt had been in chair and was basically sliding out of both slings.  Gentle passive pendulums completed.   Pt unable to comprehend pendulums therefore passive approach.       Mobility Bed Mobility               General bed mobility comments: Pt in chair on arrival.  Transfers Overall transfer level: Needs assistance Equipment used: None Transfers: Sit to/from Stand Sit to Stand: Max assist Stand pivot transfers: Max assist       General transfer comment: Pt remains to present with posterior lean which turns to flexed upper trunk with cues for hip and trunk extension.      Balance Overall balance assessment: Needs assistance Sitting-balance support: No upper extremity supported;Feet unsupported Sitting balance-Leahy Scale: Poor Sitting balance - Comments: Pt sat on edge of chair and kept leaning backwards despite being asked to sit on own. Postural control: Posterior lean Standing balance support: During functional activity;No upper extremity supported Standing balance-Leahy Scale: Poor Standing balance comment: Pt must have max assist of 1 to stand or mod assist x2 or pt will fall                           ADL either performed or assessed with clinical judgement   ADL Overall ADL's : Needs assistance/impaired Eating/Feeding: Total assistance   Grooming: Total assistance   Upper Body Bathing: Total assistance   Lower Body Bathing: Total assistance   Upper Body Dressing : Total assistance   Lower Body Dressing: Total assistance   Toilet Transfer: Maximal assistance;BSC;Stand-pivot Toilet Transfer Details (indicate cue type and reason): Pt very anxious when up on her feet unable to pick  one foot up and just walk in place.  Pt with no perceptual awareness when standing.   Toileting- Clothing Manipulation and Hygiene: Total assistance       Functional mobility during ADLs: Maximal assistance General ADL Comments: Pt continues to be unable to participate in functional activities or active exercises because of extreme confusion.       Vision        Perception     Praxis      Cognition Arousal/Alertness: Awake/alert Behavior During Therapy: Anxious Overall Cognitive Status: Impaired/Different from baseline Area of Impairment: Orientation;Attention;Memory;Following commands;Safety/judgement;Awareness;Problem solving                 Orientation Level: Disoriented to;Place;Time;Situation Current Attention Level: Focused Memory: Decreased recall of precautions;Decreased short-term memory Following Commands: Follows one step commands inconsistently;Follows one step commands with increased time Safety/Judgement: Decreased awareness of safety;Decreased awareness of deficits Awareness: Intellectual Problem Solving: Difficulty sequencing;Decreased initiation;Slow processing;Requires verbal cues;Requires tactile cues General Comments: Pt unable to answer any basic questions today and had no idea why she was in the hospital.          Exercises Other Exercises Other Exercises: Pt still with no wrist or MCP ext.   Trace elbow flexion.  Lt, pt with 1/5 finger flexion and 2/5 biceps.  Formal sensory testing not performed    Shoulder Instructions       General Comments Pt very dependent due to nature of B shoulder dislocations with B slings but more because of her cognitive state.    Pertinent Vitals/ Pain       Pain Assessment: Faces Faces Pain Scale: Hurts a little bit Pain Location: bil shoulders Pain Descriptors / Indicators: Grimacing;Guarding Pain Intervention(s): Monitored during session;Limited activity within patient's tolerance;Repositioned  Home Living                                          Prior Functioning/Environment              Frequency  Min 2X/week        Progress Toward Goals  OT Goals(current goals can now be found in the care plan section)  Progress towards OT goals: Not progressing toward goals - comment (cognition limiting progress)  Acute Rehab OT Goals Patient  Stated Goal: not stated OT Goal Formulation: Patient unable to participate in goal setting Time For Goal Achievement: 05/25/17 Potential to Achieve Goals: Fair ADL Goals Pt Will Transfer to Toilet: with min assist;stand pivot transfer;bedside commode Additional ADL Goal #1: Pt will be independent with precautions  Additional ADL Goal #2: Pt will be perform HEP with mod A  from family/staff - pendulums, wrist, hand ROM   Plan Discharge plan remains appropriate    Co-evaluation                 AM-PAC PT "6 Clicks" Daily Activity     Outcome Measure   Help from another person eating meals?: Total Help from another person taking care of personal grooming?: Total Help from another person toileting, which includes using toliet, bedpan, or urinal?: Total Help from another person bathing (including washing, rinsing, drying)?: Total Help from another person to put on and taking off regular upper body clothing?: Total Help from another person to put on and taking off regular lower body clothing?: Total 6 Click Score: 6    End of Session  OT Visit Diagnosis: Cognitive communication deficit (R41.841);Unsteadiness on feet (R26.81)   Activity Tolerance Treatment limited secondary to agitation   Patient Left in chair;with call bell/phone within reach   Nurse Communication Mobility status        Time: 9983-3825 OT Time Calculation (min): 15 min  Charges: OT General Charges $OT Visit: 1 Procedure OT Treatments $Therapeutic Exercise: 8-22 mins  Tory Emerald, OTR/L [phone]   Hope Budds 05/11/2017, 11:46 AM

## 2017-05-11 NOTE — Discharge Summary (Signed)
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 dislocation of the left humeral head with respect to the glenoid fossa. Possible cortical fracture along the mid to inferior glenoid rim. IMPRESSION: Inferior dislocation of the left humeral head with respect to the glenoid fossa. Possible small fracture fragments along the mid to inferior glenoid fossa. Electronically Signed   By: Jasmine Pang M.D.   On: 05/03/2017 19:34    Microbiology: Recent Results (from the past 240 hour(s))  Urine culture     Status: Abnormal   Collection Time: 05/04/17  6:20 AM  Result Value Ref Range Status   Specimen Description URINE, CLEAN CATCH  Final   Special Requests NONE  Final   Culture MULTIPLE SPECIES PRESENT, SUGGEST RECOLLECTION (A)  Final   Report Status 05/05/2017 FINAL  Final     Labs: Basic Metabolic Panel:  Recent Labs Lab 05/06/17 0433 05/07/17 0605 05/09/17 0448 05/10/17 0533 05/11/17 0603  NA 140 140 138 140 138  K 3.7 3.6 4.0 3.8 3.6  CL 111 110 108 108 109  CO2 19* 21* 20* 20* 21*  GLUCOSE 111* 128* 162* 131* 121*  BUN 18 11 22* 18 15  CREATININE 0.82 0.74 0.93 0.75 0.86  CALCIUM 8.4* 8.7* 9.1 9.0 8.9  MG 2.0  --   --  2.0  --     Liver Function Tests: No results for input(s): AST, ALT, ALKPHOS, BILITOT, PROT, ALBUMIN in the last 168 hours. No results for input(s): LIPASE, AMYLASE in the last 168 hours.  Recent Labs Lab 05/05/17 1227  AMMONIA 14   CBC:  Recent Labs Lab 05/05/17 0518 05/05/17 1227 05/06/17 0433 05/07/17 0605 05/10/17 0533 05/11/17 0603  WBC 11.2*  --  9.0 10.3 12.3* 10.6*  NEUTROABS  --   --   --   --  7.2  --   HGB 12.5  --  12.3 12.7 13.6 13.5  HCT 38.3 38.1 36.9 37.7 40.3 39.1  MCV 88.0  --  87.4 86.5 87.4 88.5  PLT 278  --  281 298 355 320   Cardiac Enzymes: No results for input(s): CKTOTAL, CKMB, CKMBINDEX, TROPONINI in the last 168 hours. BNP: BNP (last 3 results) No results for input(s): BNP in the last 8760 hours.  ProBNP (last 3 results) No results for input(s): PROBNP in the last 8760 hours.  CBG: No results for input(s): GLUCAP in the last 168 hours.     SignedRamiro Harvest MD.  [facility] 05/11/2017, 2:31 PM

## 2017-05-11 NOTE — Progress Notes (Signed)
Physical Therapy Treatment Patient Details Name: Kristin Conrad MRN: [id number] DOB: 04-02-1930 Today's Date: 05/11/2017    History of Present Illness Kristin Conrad was outside [employer] when she began to fall. She held on to the shopping cart with both hands as she began to fall and dislocated both shoulders. She did not lose consciousness. She was brought to [facility] and her dislocations were reduced by the EDP. She was admitted by the IM service. She lives alone in [place], [place] and was here visiting her daughter. Pt is currently delirious (possibly from UTI) and unable to contribute to history which was gleaned from chart and provided by daughter. PMH: COPD, left foot drop, TIA, afib, peripheral neuropathy and previous back and hip surgery and TKA right.   MRI negative for acute events in the brain, cervical spine does have some  Severe neural foraminal narrowing C3-4 thru C6-7    PT Comments    Pt's mobility with gait has declined since previous session. She walked 8 ft with max assist. Her gait changed from step through pattern in previous session to scissoring and ataxic today. Pts cognition makes it difficult to get answers about what she is feeling, but she was able to report a weak feeling in the legs. Pt is unaware of where she is or why she is here. Unable to answer simple questions about her past. Expressed concerns about pts mobility and cognition to RN and she is notifying MD. Patient would benefit from continued skilled PT. Will continue to follow acutely.     Follow Up Recommendations  SNF;Supervision - Intermittent     Equipment Recommendations  None recommended by PT    Recommendations for Other Services       Precautions / Restrictions Precautions Precautions: Fall Precaution Comments: Forward flexed posturing and posterior lean during session. Fear of falling Required Braces or Orthoses: Sling Restrictions Weight Bearing Restrictions: Yes RUE Weight Bearing: Non  weight bearing LUE Weight Bearing: Non weight bearing Other Position/Activity Restrictions: B slings adjusted as pt had been in chair and was basically sliding out of both slings.  Gentle passive pendulums completed.  Pt unable to comprehend pendulums therefore passive approach.    Mobility  Bed Mobility               General bed mobility comments: Pt in chair on arrival.  Transfers Overall transfer level: Needs assistance Equipment used: None Transfers: Sit to/from Stand Sit to Stand: Max assist         General transfer comment: Pt needs assist to power up into standing and position hips over feet.  Ambulation/Gait Ambulation/Gait assistance: +2 physical assistance;Max assist (assist level varried based on patient cognition.  )   Assistive device: None Gait Pattern/deviations: Decreased stride length;Shuffle;Trunk flexed;Decreased dorsiflexion - left;Ataxic;Scissoring;Step-to pattern;Narrow base of support   Gait velocity interpretation: Below normal speed for age/gender General Gait Details: Scissoring and ataxic gait. Max assist at pts hips to assist with gait and weight shifting. Pt's gait appears to have worsened since previous session. She states her legs feel weak and requires assist and cueing to weight shift. She is fearful step with her left foot.    Stairs            Wheelchair Mobility    Modified Rankin (Stroke Patients Only)       Balance Overall balance assessment: Needs assistance Sitting-balance support: No upper extremity supported;Feet unsupported Sitting balance-Leahy Scale: Poor   Postural control: Posterior lean Standing balance support: During functional activity;No  upper extremity supported Standing balance-Leahy Scale: Poor                              Cognition Arousal/Alertness: Awake/alert Behavior During Therapy: Anxious Overall Cognitive Status: Impaired/Different from baseline Area of Impairment:  Orientation;Attention;Memory;Following commands;Safety/judgement;Awareness;Problem solving                 Orientation Level: Disoriented to;Place;Time;Situation Current Attention Level: Focused Memory: Decreased recall of precautions;Decreased short-term memory Following Commands: Follows one step commands inconsistently;Follows one step commands with increased time Safety/Judgement: Decreased awareness of safety;Decreased awareness of deficits Awareness: Intellectual Problem Solving: Difficulty sequencing;Decreased initiation;Slow processing;Requires verbal cues;Requires tactile cues General Comments: Pt unable to answer any basic questions today and had no idea why she was in the hospital.        Exercises      General Comments        Pertinent Vitals/Pain Pain Assessment: No/denies pain    Home Living                      Prior Function            PT Goals (current goals can now be found in the care plan section) Acute Rehab PT Goals Patient Stated Goal: not stated PT Goal Formulation: With patient/family Time For Goal Achievement: 05/18/17 Potential to Achieve Goals: Good Progress towards PT goals: Not progressing toward goals - comment (Limited by cognition)    Frequency    Min 5X/week      PT Plan Current plan remains appropriate    Co-evaluation              AM-PAC PT "6 Clicks" Daily Activity  Outcome Measure  Difficulty turning over in bed (including adjusting bedclothes, sheets and blankets)?: Total Difficulty moving from lying on back to sitting on the side of the bed? : Total Difficulty sitting down on and standing up from a chair with arms (e.g., wheelchair, bedside commode, etc,.)?: Total Help needed moving to and from a bed to chair (including a wheelchair)?: A Lot Help needed walking in hospital room?: A Lot Help needed climbing 3-5 steps with a railing? : Total 6 Click Score: 8    End of Session Equipment Utilized  During Treatment: Gait belt Activity Tolerance: Other (comment) (limited by cognitive status) Patient left: in chair;with chair alarm set Nurse Communication: Mobility status;Other (comment) (concerns about pts regression in mobility) PT Visit Diagnosis: Unsteadiness on feet (R26.81);Muscle weakness (generalized) (M62.81);Pain Pain - Right/Left: Right Pain - part of body: Shoulder     Time: 1455-1533 PT Time Calculation (min) (ACUTE ONLY): 38 min  Charges:  $Gait Training: 23-37 mins $Therapeutic Activity: 8-22 mins                    G Codes:       Kallie LocksHannah Rubye Strohmeyer, PTA Acute Rehab [phone]   Sheral ApleyHannah E Antwonette Feliz 05/11/2017, 3:44 PM

## 2017-05-18 ENCOUNTER — Ambulatory Visit (INDEPENDENT_AMBULATORY_CARE_PROVIDER_SITE_OTHER): Payer: Medicare Other | Admitting: Orthopaedic Surgery

## 2017-05-18 ENCOUNTER — Encounter (INDEPENDENT_AMBULATORY_CARE_PROVIDER_SITE_OTHER): Payer: Self-pay | Admitting: Orthopaedic Surgery

## 2017-05-18 DIAGNOSIS — G5693 Unspecified mononeuropathy of bilateral upper limbs: Secondary | ICD-10-CM

## 2017-05-18 DIAGNOSIS — S43006D Unspecified dislocation of unspecified shoulder joint, subsequent encounter: Secondary | ICD-10-CM

## 2017-05-18 NOTE — Progress Notes (Signed)
Office Visit Note   Patient: Kristin Conrad           Date of Birth: 1930-05-22           MRN: [id number] Visit Date: 05/18/2017              Requested by: No referring provider defined for this encounter. PCP: System, Pcp Not In   Assessment & Plan: Visit Diagnoses:  1. Dislocation of shoulder region, unspecified laterality, subsequent encounter   2. Bilateral neuropathy of upper extremities     Plan: She'll start some therapy at the skilled facility with gentle shoulder range of motion avoiding external rotation and abduction until she 6 weeks after dislocation. Currently she is only about 2 weeks. OT can work on wrist and finger extension on the right and wrist and finger flexion on the left. MRI of her neck showed no subacute injury she does have the degenerative anterolisthesis of C6-7 but her exam of the upper extremities has a mixed pattern nerve root that suggests she may have stretched or brachial plexus when she fell and dislocated her shoulders. 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 There were no acute changes in her head that would correspond with her upper extremity weakness. I'll recheck her in 4 weeks. I discussed with the daughter this is extremely complex problem with old foot drop. Cerebellar changes from old infarct. Balance problems with bilateral shoulder dislocations and now with upper extremity likely brachial plexus stretch injury versus cord contusion that did not show on her initial cervical MRI scan. She did have a little listhesis at the C6-7 level. Previous posterior decompression but no evidence of significant disc herniation and no cord compression noted with no abnormal cord signal noted on cervical MRI. I reviewed the brain MRI with the patient and daughter gave them a copy of the report reviewed cervical MRI. Reviewed pre-and post reductions of the shoulders no fracture was seen. Limited external rotation and  abduction until she 6 weeks post injury. Recheck 4 weeks. She has persistent problems with may need to consider EMGs nerve conduction velocities but this likely is a brachial plexus stretch injury from the shoulder dislocation and healthy she'll get some improvement with time and therapy.  Follow-Up Instructions: Return in about 4 weeks (around 06/15/2017).   Orders:  No orders of the defined types were placed in this encounter.  No orders of the defined types were placed in this encounter.     Procedures: No procedures performed   Clinical Data: No additional findings.   Subjective: Chief Complaint  Patient presents with  . Right Shoulder - Follow-up, Pain  . Left Shoulder - Pain, Follow-up    HPI patient was at [facility] normally uses a walker with tennis balls on the back and was using a shopping cart walking out of [employer] and she fell forward hitting on the shopping cart with bilateral shoulder dislocations. Shoulders reduced in the emergency room by the ER physician and she had bilateral weakness of the upper extremities with more wrist extension and finger extension weakness on the right and weakness of wrist flexion and finger flexion on the left. She has some biceps weakness bilaterally.  Review of Systems patient lives in [place] prior to being with her daughter. Do not have any old medical records. Past history of lumbar surgeries with foot drop on the left. COPD delirium. Acute UTI when she was in the hospital. Multilevel  posterior cervical decompression surgery without instrumented fusion. Otherwise negative as it pertains to her history of present illness.   Objective: Vital Signs: There were no vitals taken for this visit.  Physical Exam  Constitutional: She is oriented to person, place, and time. She appears well-developed.  HENT:  Head: Normocephalic.  Right Ear: External ear normal.  Left Ear: External ear normal.  Eyes: Pupils are equal, round, and reactive to  light.  Neck: No tracheal deviation present. No thyromegaly present.  Cardiovascular: Normal rate.   Pulmonary/Chest: Effort normal.  Abdominal: Soft.  Musculoskeletal:  Right wrist the weakness she fires her a ECRB better than he see you. With attempts at gripping she is unable to extend her wrist. Finger extension weakness on the right. Left wrist has an ability to flex her fingers. Wrist flexion weakness. Bilateral biceps weakness triceps are strong as expected for age at age 83. She has good cervical range of motion right left without pain posterior cervical incision well-healed. Foot drop on the left. Mild edema lower extremities. Ulnar nerve at the elbow right and left is normal. She has bilateral slings applied.  Neurological: She is alert and oriented to person, place, and time.  Skin: Skin is warm and dry.  Psychiatric: She has a normal mood and affect. Her behavior is normal.  Patient has some memory issues.    Ortho Exam  Specialty Comments:  No specialty comments available.  Imaging: No results found.   PMFS History: Patient Active Problem List   Diagnosis Date Noted  . Leukocytosis   . Agitation 05/08/2017  . Sundowning 05/08/2017  . Bilateral neuropathy of upper extremities   . Acute metabolic encephalopathy 05/04/2017  . Fall 05/03/2017  . Shoulder dislocation 05/03/2017  . Hypokalemia 05/03/2017  . COPD (chronic obstructive pulmonary disease) (HCC) 05/03/2017  . Acute lower UTI 05/03/2017  . Delirium 05/03/2017   Past Medical History:  Diagnosis Date  . COPD (chronic obstructive pulmonary disease) (HCC)   . Left foot drop   . Lone atrial fibrillation (HCC)   . Peripheral neuropathy   . TIA (transient ischemic attack)     Family History  Problem Relation Age of Onset  . Stroke Father     Past Surgical History:  Procedure Laterality Date  . BACK SURGERY    . HIP SURGERY    . JOINT REPLACEMENT     Social History   Occupational History  . Not on  file.   Social History Main Topics  . Smoking status: Never Smoker  . Smokeless tobacco: Never Used  . Alcohol use No  . Drug use: No  . Sexual activity: Not on file

## 2017-05-20 ENCOUNTER — Encounter: Payer: Self-pay | Admitting: Neurology

## 2017-06-15 ENCOUNTER — Ambulatory Visit (INDEPENDENT_AMBULATORY_CARE_PROVIDER_SITE_OTHER): Payer: Medicare Other | Admitting: Orthopaedic Surgery

## 2017-06-15 ENCOUNTER — Encounter (INDEPENDENT_AMBULATORY_CARE_PROVIDER_SITE_OTHER): Payer: Self-pay | Admitting: Orthopaedic Surgery

## 2017-06-15 DIAGNOSIS — S43006D Unspecified dislocation of unspecified shoulder joint, subsequent encounter: Secondary | ICD-10-CM

## 2017-06-15 NOTE — Progress Notes (Signed)
Office Visit Note   Patient: Kristin Conrad           Date of Birth: 03-17-1930           MRN: [id number] Visit Date: 06/15/2017              Requested by: No referring provider defined for this encounter. PCP: System, Pcp Not In   Assessment & Plan: Visit Diagnoses:  1. Dislocation of shoulder region, unspecified laterality, subsequent encounter          Bilateral and related to a fall on 05/03/2017. 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  Plan: She can discontinue the sling she can work on upper extremity strengthening she can begin to weight-bear using the walker with weightbearing through her arms.: Need to make sure she is with a therapist when she gets up so she doesn't slip and fall again and re-dislocate her shoulders. We discussed avoiding abduction and external rotation. I plan to recheck her in 2 months.  Follow-Up Instructions: No Follow-up on file.   Orders:  No orders of the defined types were placed in this encounter.  No orders of the defined types were placed in this encounter.     Procedures: No procedures performed   Clinical Data: No additional findings.   Subjective: Chief Complaint  Patient presents with  . Neck - Follow-up, Pain  . Right Shoulder - Follow-up, Dislocation  . Left Shoulder - Follow-up, Dislocation    HPI 83 year old female returns she [facility]. She had a fall coming out of a store on 05/04/1979 with bilateral shoulder dislocations every reduced in the emergency room. She's been doing some therapy for lower extremities. Now been adequately to time since her injury we will discontinue both slings. She can begin range of motion exercises with therapy.  Review of Systems 14 point review of systems is updated and is  unchanged since her hospitalization other than as mentioned in history of present illness.   Objective: Vital Signs: There were no vitals taken for this visit.  Physical Exam  Constitutional: She is oriented to person, place, and time. She appears well-developed.  HENT:  Head: Normocephalic.  Right Ear: External ear normal.  Left Ear: External ear normal.  Eyes: Pupils are equal, round, and reactive to light.  Neck: No tracheal deviation present. No thyromegaly present.  Cardiovascular: Normal rate.   Pulmonary/Chest: Effort normal.  Abdominal: Soft.  Musculoskeletal:  Patient has bilateral intrinsic atrophy or hands. PIP contractures of the right long finger. She has some weakness with wrist extension which is less than finger extension weakness.  Neurological: She is alert and oriented to person, place, and time.  Skin: Skin is warm and dry.  Psychiatric: She has a normal mood and affect. Her behavior is normal.    Ortho Exam patient still has the left foot drop. Previous notes are brought in by her daughter including CT scan of her head which showed some generalized atrophy of periventricular white matter with small vessel ischemic changes. Cervical MRI from 2010 which showed the moderate stenosis C3-4 down to C6-7 and this was before her posterior cervical laminectomy. Patient in a wheelchair. Patient has the biceps weakness. Generalized weakness of the triceps as well.  Specialty Comments:  No specialty comments available.  Imaging: Show images for MR CERVICAL  SPINE WO CONTRAST  Study Result   CLINICAL DATA:  Bilateral upper extremity neuropathy. Status post bilateral shoulder dislocation at [facility] yesterday. Possible brachials plexus injury or central brain lesion. History of LEFT foot drop, metabolic encephalopathy.  EXAM: MRI HEAD WITHOUT CONTRAST  MRI CERVICAL SPINE WITHOUT CONTRAST  TECHNIQUE: Multiplanar, multiecho pulse sequences of the brain and  surrounding structures, and cervical spine, to include the craniocervical junction and cervicothoracic junction, were obtained without intravenous contrast.  COMPARISON:  CT HEAD and cervical spine May 14th 1,018  FINDINGS: MRI HEAD FINDINGS  BRAIN: No reduced diffusion to suggest acute ischemia. No susceptibility artifact to suggest hemorrhage. Moderate ventriculomegaly with disproportionate mild sulcal effacement at the convexities. Confluent supratentorial and patchy pontine white matter T2 hyperintensities. Old small RIGHT cerebellar infarct. No suspicious parenchymal signal, masses or mass effect. No abnormal extra-axial fluid collections.  VASCULAR: Normal major intracranial vascular flow voids present at skull base.  SKULL AND UPPER CERVICAL SPINE: No abnormal sellar expansion. No suspicious calvarial bone marrow signal. Craniocervical junction maintained.  SINUSES/ORBITS: The mastoid air-cells and included paranasal sinuses are well-aerated. The included ocular globes and orbital contents are non-suspicious. Status post bilateral ocular lens implant.  OTHER: None.  MRI CERVICAL SPINE FINDINGS  ALIGNMENT: Straightened cervical lordosis. Grade 2 (5 mm) C6-7 anterolisthesis.  VERTEBRAE/DISCS: Vertebral bodies are intact. Status C3 through C6 laminectomies. Severe C6-7 disc height loss, mild at C3-4 through C5-6. Decreased T2 signal within all disc compatible with desiccation. Multilevel mild chronic discogenic endplate changes. No abnormal or acute bone marrow signal though, stir sequences moderately motion degraded.  CORD:Cervical spinal cord is normal morphology and signal characteristics from the cervicomedullary junction to level of T3-4, the most caudal well visualized level.  POSTERIOR FOSSA, VERTEBRAL ARTERIES, PARASPINAL TISSUES: No MR findings of ligamentous injury. Vertebral artery flow voids present. Focal bright STIR signal overlying C7  spinous process, nuchal ligament is not apposed to the spinous process. Upper paraspinal muscle atrophy.  DISC LEVELS (moderately motion degraded axial sequences):  C2-3: No disc bulge. Severe RIGHT facet arthropathy without canal stenosis. Mild RIGHT neural foraminal narrowing.  C3-4: Posterior decompression. Small broad-based disc bulge, uncovertebral hypertrophy and moderate to severe facet arthropathy without canal stenosis. Mild RIGHT, severe LEFT neural foraminal narrowing.  C4-5: Posterior decompression. Small broad-based disc bulge in uncovertebral hypertrophy, severe RIGHT and moderate to severe LEFT facet arthropathy. No canal stenosis. Severe bilateral neural foraminal narrowing.  C5-6: Posterior decompression. Uncovertebral hypertrophy and severe facet arthropathy without canal stenosis. Moderate to severe RIGHT and severe LEFT neural foraminal narrowing.  C6-7: Posterior decompression at C6. Anterolisthesis. Uncovertebral hypertrophy and moderate facet arthropathy without canal stenosis. Severe bilateral neural foraminal narrowing.  C7-T1: No disc bulge. Moderate facet arthropathy without canal stenosis or neural foraminal narrowing.  IMPRESSION: MRI HEAD:  No acute intracranial process.  Moderate to severe chronic small vessel ischemic disease and old small RIGHT cerebellar infarct.  Mild suspected normal pressure hydrocephalus.  MRI CERVICAL SPINE: Motion degraded examination. Status post C3 through C6 laminectomies. Grade 2 C6-7 anterolisthesis compatible with adjacent segment disease.  Focal C7 spinous bursitis versus avulsion injury. Recommend correlation with point tenderness.  No canal stenosis. Severe neural foraminal narrowing C3-4 thru C6-7.   Electronically Signed   By: Awilda Metro M.D.   On: 05/05/2017 22:41      PMFS History: Patient Active Problem List   Diagnosis Date Noted  . Leukocytosis   . Agitation  05/08/2017  . Sundowning 05/08/2017  . Bilateral neuropathy of  upper extremities   . Acute metabolic encephalopathy 05/04/2017  . Fall 05/03/2017  . Shoulder dislocation 05/03/2017  . Hypokalemia 05/03/2017  . COPD (chronic obstructive pulmonary disease) (HCC) 05/03/2017  . Acute lower UTI 05/03/2017  . Delirium 05/03/2017   Past Medical History:  Diagnosis Date  . COPD (chronic obstructive pulmonary disease) (HCC)   . Left foot drop   . Lone atrial fibrillation (HCC)   . Peripheral neuropathy   . TIA (transient ischemic attack)     Family History  Problem Relation Age of Onset  . Stroke Father     Past Surgical History:  Procedure Laterality Date  . BACK SURGERY    . HIP SURGERY    . JOINT REPLACEMENT     Social History   Occupational History  . Not on file.   Social History Main Topics  . Smoking status: Never Smoker  . Smokeless tobacco: Never Used  . Alcohol use No  . Drug use: No  . Sexual activity: Not on file

## 2017-07-27 MED FILL — NITROFURANTOIN MONO-MCR 100: 100 | 7 days supply | Qty: 14 | Fill #0

## 2017-08-17 ENCOUNTER — Ambulatory Visit (INDEPENDENT_AMBULATORY_CARE_PROVIDER_SITE_OTHER): Payer: Medicare Other | Admitting: Orthopaedic Surgery

## 2017-08-17 ENCOUNTER — Encounter (INDEPENDENT_AMBULATORY_CARE_PROVIDER_SITE_OTHER): Payer: Self-pay | Admitting: Orthopaedic Surgery

## 2017-08-17 VITALS — BP 131/74 | HR 72

## 2017-08-17 DIAGNOSIS — S43006D Unspecified dislocation of unspecified shoulder joint, subsequent encounter: Secondary | ICD-10-CM | POA: Diagnosis not present

## 2017-08-17 NOTE — Progress Notes (Signed)
Office Visit Note   Patient: Kristin Conrad           Date of Birth: 1930/08/18           MRN: [id number] Visit Date: 08/17/2017              Requested by: No referring provider defined for this encounter. PCP: System, Pcp Not In   Assessment & Plan: Visit Diagnoses:  1. Dislocation of shoulder region, unspecified laterality, subsequent encounter        Bilateral shoulder reduction done emergency room with left brachial plexus stretch injury.  Plan: She can continue to work on ambulation trial increase her distance and stamina. She can continue to do activities with her arm to chronic try to gradually strengthen her arm. She understands that she may get some gradual return of the proximal shoulder girdle muscle partially with time. We explained that the improvement is a very slow process that takes many months. She can follow-up with me as needed. I do not think she is a candidate for muscle transfers for her brachial plexus stretch injury and this is the same advice she got at [facility]  Follow-Up Instructions: No Follow-up on file.   Orders:  No orders of the defined types were placed in this encounter.  No orders of the defined types were placed in this encounter.     Procedures: No procedures performed   Clinical Data: No additional findings.   Subjective: Chief Complaint  Patient presents with  . Right Shoulder - Follow-up  . Left Shoulder - Follow-up    HPI patient returns for follow-up of bilateral shoulder dislocation. On the left she likely had a brachial plexus stretch injury at the time. She guarded had previous cervical spine surgery and imaging did not reveal significant disc herniations. She had posterior decompression and see 6 and also at C5 as well as C4 C3 4. She did have some facet arthropathy. She had previous electrical test that was done at [facility]. No surgery was recommended at that time.  Review of Systems positive for previous lumbar  surgery with some neuropathy she uses a AFO on the left chronically at least for more than 5 years. She states her memory is decreased compared to past years, positive for COPD. Previous shoulder dislocations bilaterally with reduction and no fracture was present.   Objective: Vital Signs: BP 131/74   Pulse 72   Physical Exam  Constitutional: She is oriented to person, place, and time. She appears well-developed.  HENT:  Head: Normocephalic.  Right Ear: External ear normal.  Left Ear: External ear normal.  Eyes: Pupils are equal, round, and reactive to light.  Neck: No tracheal deviation present. No thyromegaly present.  Cardiovascular: Normal rate.   Pulmonary/Chest: Effort normal.  Abdominal: Soft.  Musculoskeletal:  Patient has a foot drop on the left. She has the atrophy and weakness of deltoid on the left as well as biceps triceps are still fairly strong. She has fairly good grip strength some difficulty with index finger time judging distal palmar crease no interosseous atrophy.  Neurological: She is alert and oriented to person, place, and time.  Skin: Skin is warm and dry.  Psychiatric: She has a normal mood and affect. Her behavior is normal.    Ortho Exam  Specialty Comments:  No specialty comments available.  Imaging: No results found.   PMFS History: Patient Active Problem List   Diagnosis Date Noted  . Leukocytosis   . Agitation  05/08/2017  . Sundowning 05/08/2017  . Bilateral neuropathy of upper extremities   . Acute metabolic encephalopathy 05/04/2017  . Fall 05/03/2017  . Shoulder dislocation 05/03/2017  . Hypokalemia 05/03/2017  . COPD (chronic obstructive pulmonary disease) (HCC) 05/03/2017  . Acute lower UTI 05/03/2017  . Delirium 05/03/2017   Past Medical History:  Diagnosis Date  . COPD (chronic obstructive pulmonary disease) (HCC)   . Left foot drop   . Lone atrial fibrillation (HCC)   . Peripheral neuropathy   . TIA (transient ischemic  attack)     Family History  Problem Relation Age of Onset  . Stroke Father     Past Surgical History:  Procedure Laterality Date  . BACK SURGERY    . HIP SURGERY    . JOINT REPLACEMENT     Social History   Occupational History  . Not on file.   Social History Main Topics  . Smoking status: Never Smoker  . Smokeless tobacco: Never Used  . Alcohol use No  . Drug use: No  . Sexual activity: Not on file

## 2017-08-20 ENCOUNTER — Ambulatory Visit: Payer: Medicare Other | Admitting: Neurology

## 2017-12-06 MED FILL — NITROFURANTOIN MONOHYD MACR: 100 | 7 days supply | Qty: 14 | Fill #0

## 2017-12-06 MED FILL — PROAIR RESPICLICK INHAL PWD: 108 (90 BAS | 17 days supply | Qty: 1 | Fill #0

## 2017-12-06 MED FILL — LISINOPRIL 10 MG TABLET: 10 | 30 days supply | Qty: 30 | Fill #0

## 2017-12-09 MED FILL — LOSARTAN POTASSIUM 25 MG TA: 25 | 30 days supply | Qty: 30 | Fill #0

## 2018-01-18 MED FILL — NITROFURANTOIN MONO-MCR 100: 100 | 10 days supply | Qty: 20 | Fill #0

## 2018-01-20 MED FILL — CIPROFLOXACIN HCL 500 MG TA: 500 | 5 days supply | Qty: 10 | Fill #0

## 2018-03-18 MED FILL — TOLTERODINE TART ER 4 MG CA: 4 | 30 days supply | Qty: 30 | Fill #0

## 2018-04-15 MED FILL — TOLTERODINE TART ER 4 MG CA: 4 | 30 days supply | Qty: 30 | Fill #1

## 2018-04-21 MED FILL — NITROFURANTOIN MONO-MCR 100: 100 | 10 days supply | Qty: 20 | Fill #0

## 2018-08-09 MED FILL — NYSTATIN 100,000 UNIT/GM PO: 100000 | 14 days supply | Qty: 15 | Fill #0

## 2018-08-09 MED FILL — FLUCONAZOLE 150 MG TABS: 150 | 1 days supply | Qty: 1 | Fill #0

## 2018-09-21 MED FILL — NITROFURANTOIN MONO-MCR 100: 100 | 30 days supply | Qty: 30 | Fill #0

## 2018-09-21 MED FILL — CYCLOBENZAPRINE 5 MG TABLET: 5 | 15 days supply | Qty: 15 | Fill #0

## 2019-02-23 IMAGING — MR MR CERVICAL SPINE W/O CM
9 of 16 series · 26 of 48 positions shown · non-contrast
Comparison: CT HEAD and cervical spine [DATE]

CLINICAL DATA: Bilateral upper extremity neuropathy. Status post
bilateral shoulder dislocation at [employer] yesterday. Possible
brachials plexus injury or central brain lesion. History of LEFT
foot drop, metabolic encephalopathy.

EXAM:
MRI HEAD WITHOUT CONTRAST
MRI CERVICAL SPINE WITHOUT CONTRAST
TECHNIQUE: Multiplanar, multiecho pulse sequences of the brain and surrounding
structures, and cervical spine, to include the craniocervical
junction and cervicothoracic junction, were obtained without
intravenous contrast.

[Series 3: DWI · axial · 3.0mm · 1.09mm/px · z∈[-71,+66]mm · 8 of 94 slices shown (1 of 2)]
[im 1/94]
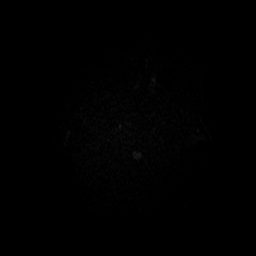
[im 14/94]
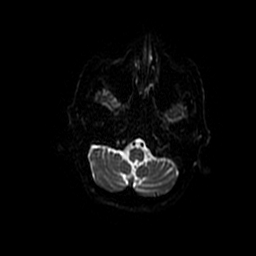
[im 27/94]
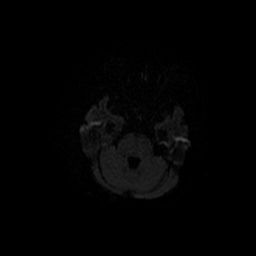
[im 40/94]
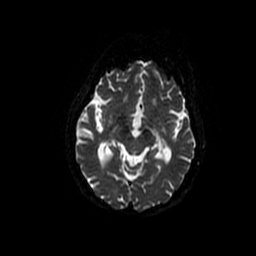
[im 54/94]
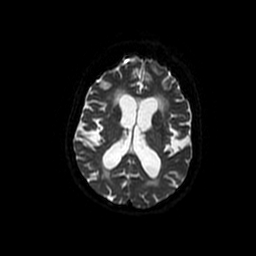
[im 67/94]
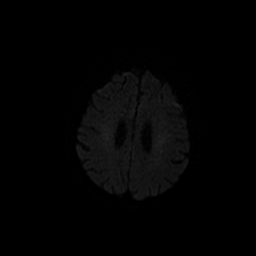
[im 80/94]
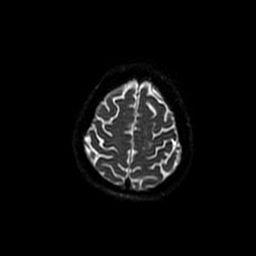
[im 94/94]
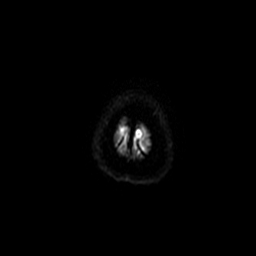

[Series 4: DWI · coronal · 5.0mm · 1.09mm/px · 5 of 66 slices shown (2 of 2)]
[im 1/66]
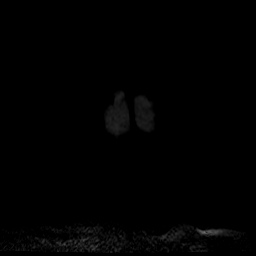
[im 17/66]
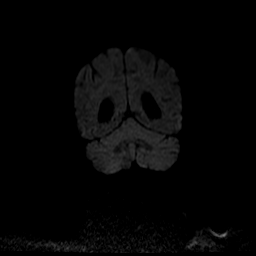
[im 33/66]
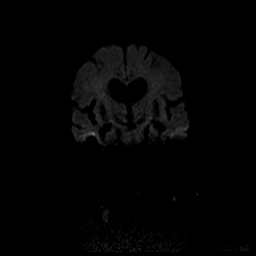
[im 49/66]
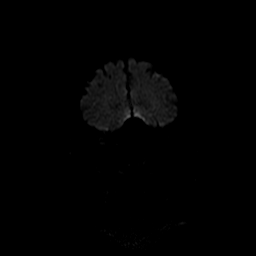
[im 66/66]
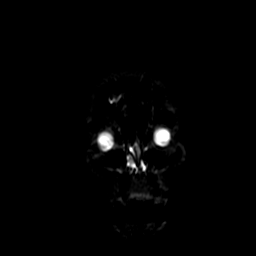

[Series 5: T1 · sagittal · 5.0mm · 0.47mm/px · 2 of 23 slices shown (1 of 2)]
[im 1/23]
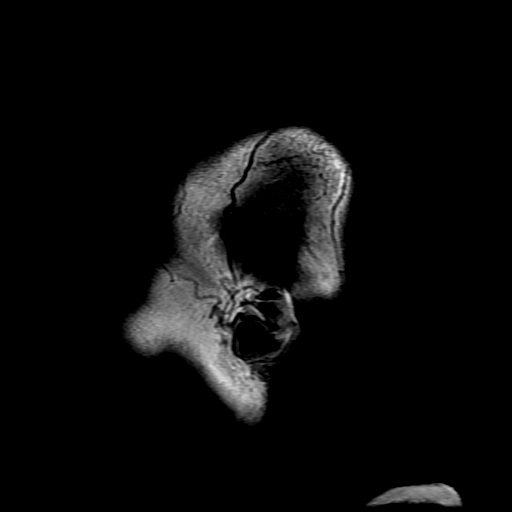
[im 23/23]
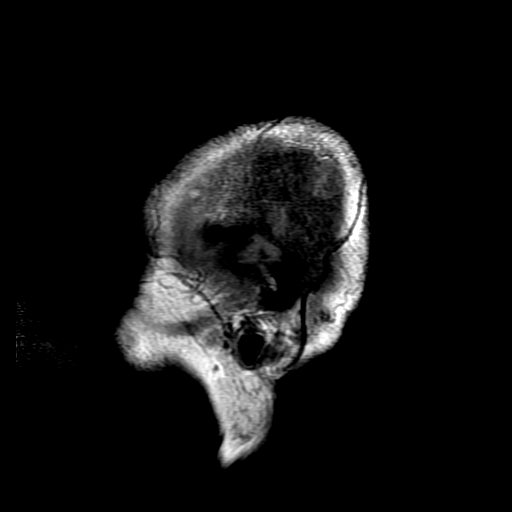

[Series 6: T2 · axial · 5.0mm · 0.43mm/px · z∈[-72,+65]mm · 2 of 24 slices shown (1 of 4)]
[im 1/24]
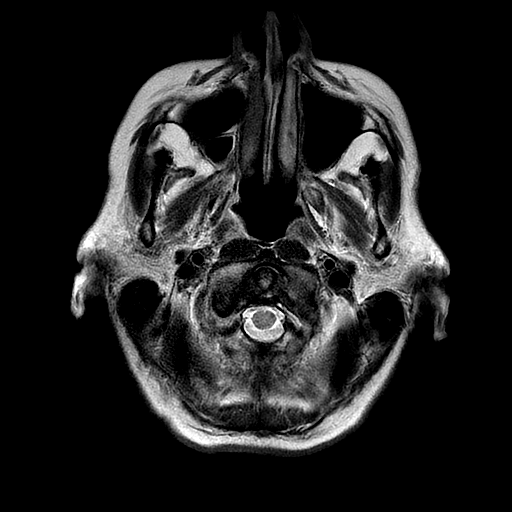
[im 24/24]
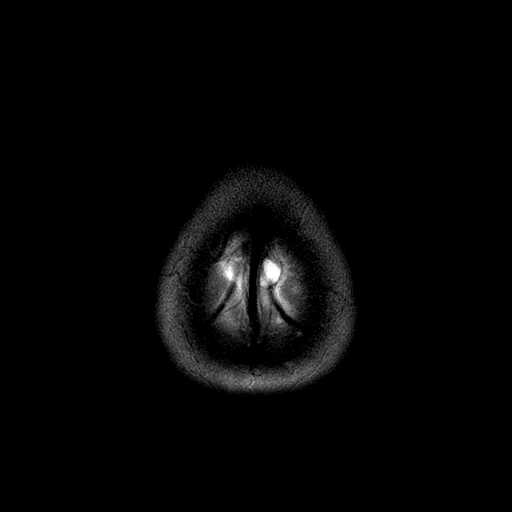

[Series 7: FLAIR · axial · 5.0mm · 0.43mm/px · z∈[-72,+65]mm · 2 of 24 slices shown]
[im 1/24]
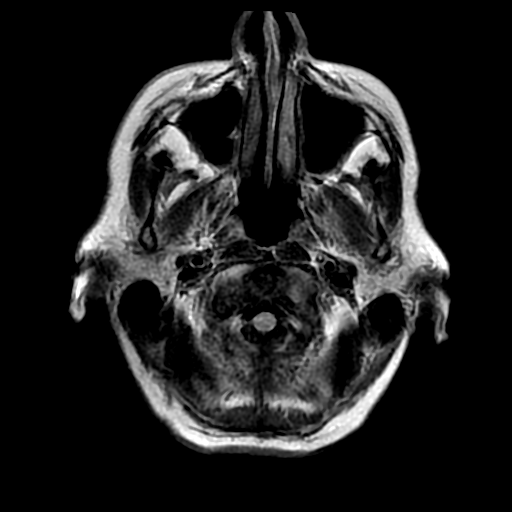
[im 24/24]
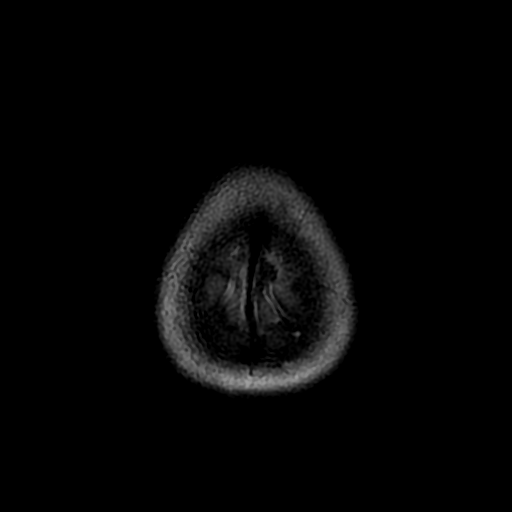

[Series 10: T2 · coronal · 5.0mm · 0.43mm/px · 2 of 28 slices shown (2 of 4)]
[im 1/28]
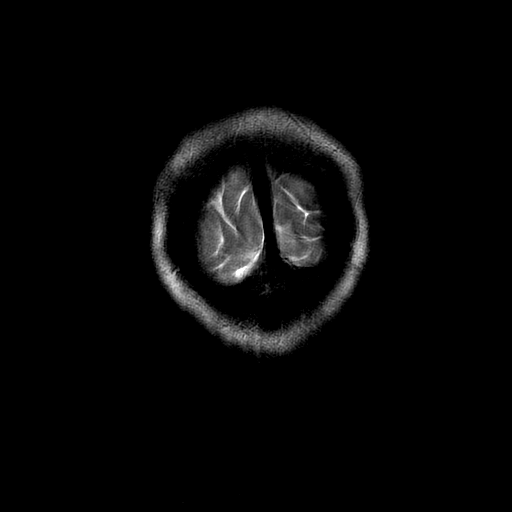
[im 28/28]
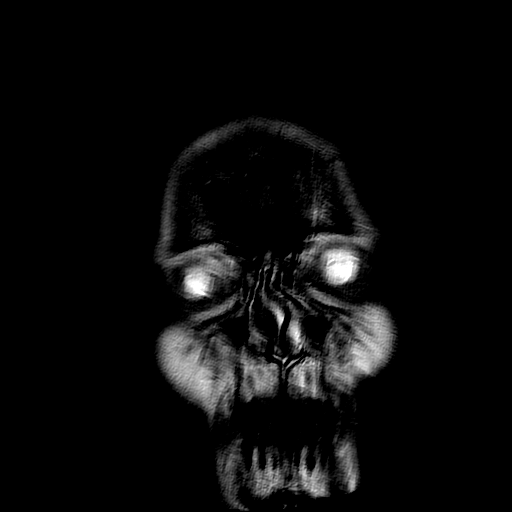

[Series 12: T2 · sagittal · 3.0mm · 0.43mm/px · 1 of 13 slices shown (3 of 4)]
[im 1/13]
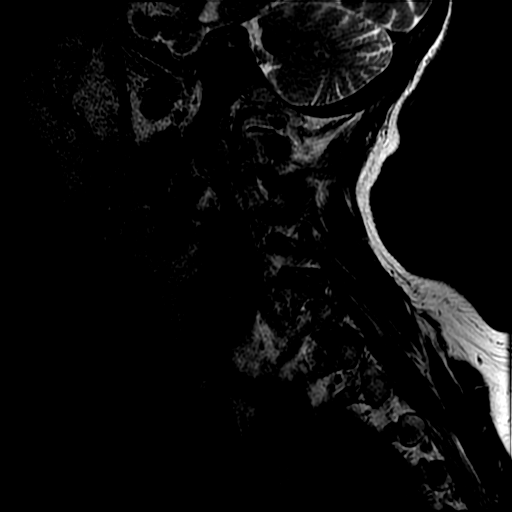

[Series 13: T1 · sagittal · 3.0mm · 0.43mm/px · 1 of 13 slices shown (2 of 2)]
[im 1/13]
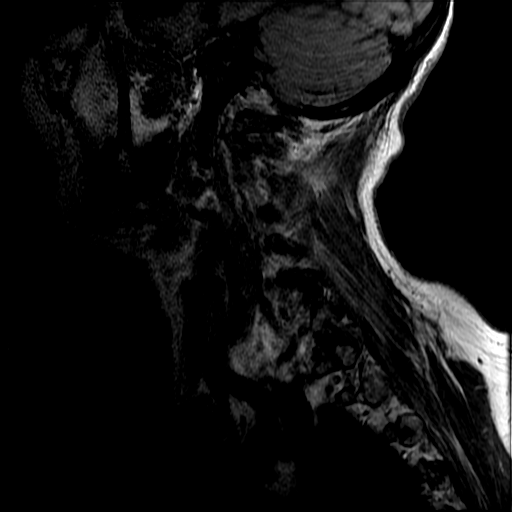

[Series 17: T2 · axial · 3.0mm · 0.39mm/px · z∈[-263,-138]mm · 3 of 39 slices shown (4 of 4)]
[im 1/39]
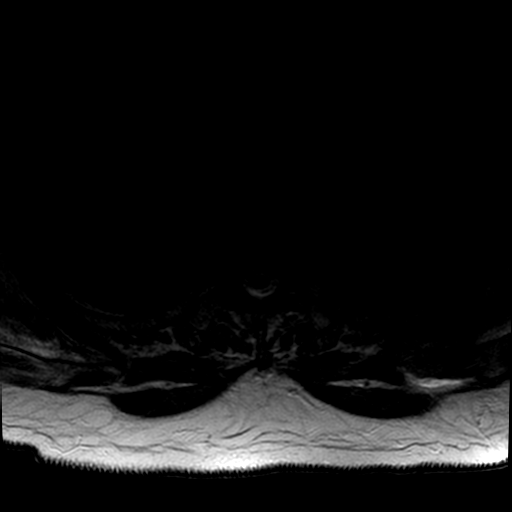
[im 20/39]
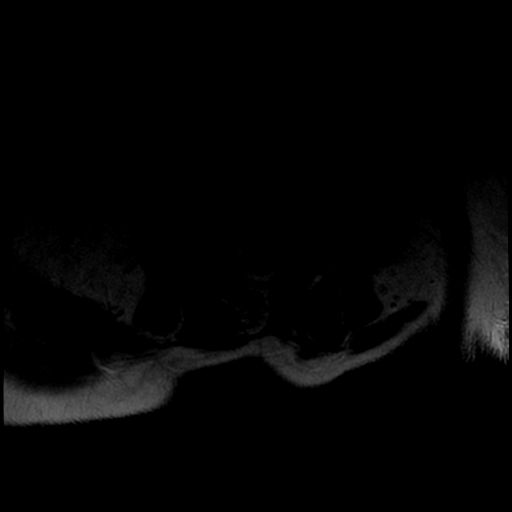
[im 39/39]
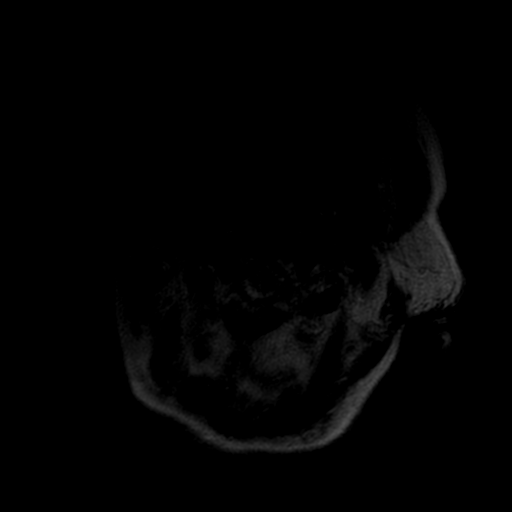

[26 of 48 positions shown; findings below may reference images not displayed]

FINDINGS: MRI HEAD FINDINGS

BRAIN: No reduced diffusion to suggest acute ischemia. No
susceptibility artifact to suggest hemorrhage. Moderate
ventriculomegaly with disproportionate mild sulcal effacement at the
convexities. Confluent supratentorial and patchy pontine white
matter T2 hyperintensities. Old small RIGHT cerebellar infarct. No
suspicious parenchymal signal, masses or mass effect. No abnormal
extra-axial fluid collections.

VASCULAR: Normal major intracranial vascular flow voids present at
skull base.

SKULL AND UPPER CERVICAL SPINE: No abnormal sellar expansion. No
suspicious calvarial bone marrow signal. Craniocervical junction
maintained.

SINUSES/ORBITS: The mastoid air-cells and included paranasal sinuses
are well-aerated. The included ocular globes and orbital contents
are non-suspicious. Status post bilateral ocular lens implant.

OTHER: None.

MRI CERVICAL SPINE FINDINGS

ALIGNMENT: Straightened cervical lordosis. Grade 2 (5 mm) C6-7
anterolisthesis.

VERTEBRAE/DISCS: Vertebral bodies are intact. Status C3 through C6
laminectomies. Severe C6-7 disc height loss, mild at C3-4 through
C5-6. Decreased T2 signal within all disc compatible with
desiccation. Multilevel mild chronic discogenic endplate changes. No
abnormal or acute bone marrow signal though, stir sequences
moderately motion degraded.

CORD:Cervical spinal cord is normal morphology and signal
characteristics from the cervicomedullary junction to level of T3-4,
the most caudal well visualized level.

POSTERIOR FOSSA, VERTEBRAL ARTERIES, PARASPINAL TISSUES: No MR
findings of ligamentous injury. Vertebral artery flow voids present.
Focal bright STIR signal overlying C7 spinous process, nuchal
ligament is not apposed to the spinous process. Upper paraspinal
muscle atrophy.

DISC LEVELS (moderately motion degraded axial sequences):

C2-3: No disc bulge. Severe RIGHT facet arthropathy without canal
stenosis. Mild RIGHT neural foraminal narrowing.

C3-4: Posterior decompression. Small broad-based disc bulge,
uncovertebral hypertrophy and moderate to severe facet arthropathy
without canal stenosis. Mild RIGHT, severe LEFT neural foraminal
narrowing.

C4-5: Posterior decompression. Small broad-based disc bulge in
uncovertebral hypertrophy, severe RIGHT and moderate to severe LEFT
facet arthropathy. No canal stenosis. Severe bilateral neural
foraminal narrowing.

C5-6: Posterior decompression. Uncovertebral hypertrophy and severe
facet arthropathy without canal stenosis. Moderate to severe RIGHT
and severe LEFT neural foraminal narrowing.

C6-7: Posterior decompression at C6. Anterolisthesis. Uncovertebral
hypertrophy and moderate facet arthropathy without canal stenosis.
Severe bilateral neural foraminal narrowing.

C7-T1: No disc bulge. Moderate facet arthropathy without canal
stenosis or neural foraminal narrowing.
IMPRESSION: MRI HEAD:  No acute intracranial process.

Moderate to severe chronic small vessel ischemic disease and old
small RIGHT cerebellar infarct.

Mild suspected normal pressure hydrocephalus.

MRI CERVICAL SPINE: Motion degraded examination. Status post C3
through C6 laminectomies. Grade 2 C6-7 anterolisthesis compatible
with adjacent segment disease.

Focal C7 spinous bursitis versus avulsion injury. Recommend
correlation with point tenderness.

No canal stenosis. Severe neural foraminal narrowing C3-4 thru C6-7.

## 2019-03-01 MED FILL — DOXYCYCLINE HYCLATE 100 MG: 100 | 10 days supply | Qty: 20 | Fill #0

## 2019-03-08 MED FILL — CIPROFLOXACIN HCL 500 MG TA: 500 | 10 days supply | Qty: 20 | Fill #0

## 2019-03-08 MED FILL — CEPHALEXIN 500 MG CAPSULE: 500 | 10 days supply | Qty: 20 | Fill #0

## 2019-04-08 MED FILL — DOXYCYCLINE HYCLATE 100 MG: 100 | 7 days supply | Qty: 14 | Fill #0

## 2019-04-08 MED FILL — MUPIROCIN 2% OINTMENT: 2 | 30 days supply | Qty: 22 | Fill #0

## 2019-05-19 MED FILL — DONEPEZIL HCL 10 MG TABLET: 10 | 45 days supply | Qty: 30 | Fill #0

## 2019-06-26 MED FILL — DONEPEZIL HCL 10 MG TABLET: 10 | 45 days supply | Qty: 30 | Fill #1

## 2019-07-20 MED FILL — MEMANTINE HCL 5 MG TABS: 5 | 30 days supply | Qty: 78 | Fill #0

## 2019-07-20 MED FILL — SERTRALINE HCL 50 MG TABS: 50 | 30 days supply | Qty: 30 | Fill #0

## 2019-07-21 MED FILL — CEPHALEXIN 500 MG CAPSULE: 500 | 7 days supply | Qty: 21 | Fill #0

## 2019-08-24 MED FILL — SERTRALINE HCL 50 MG TABS: 50 | 30 days supply | Qty: 30 | Fill #1

## 2019-08-25 MED FILL — MEMANTINE HCL 10 MG TABS: 10 | 30 days supply | Qty: 60 | Fill #0

## 2019-09-20 MED FILL — SERTRALINE HCL 50 MG TABS: 50 | 90 days supply | Qty: 180 | Fill #0

## 2019-09-20 MED FILL — MEMANTINE HCL 10 MG TABS: 10 | 45 days supply | Qty: 90 | Fill #0

## 2019-09-25 MED FILL — DONEPEZIL HCL 10 MG TABLET: 10 | 90 days supply | Qty: 90 | Fill #0

## 2019-11-20 MED FILL — MEMANTINE HCL 10 MG TABS: 10 | 45 days supply | Qty: 90 | Fill #1

## 2020-01-01 MED FILL — SERTRALINE HCL 50 MG TABLET: 50 | 90 days supply | Qty: 180 | Fill #1

## 2020-01-17 MED FILL — KETOCONAZOLE 2% SHAMPOO: 2 | 14 days supply | Qty: 120 | Fill #0

## 2020-01-19 MED FILL — MEMANTINE HCL 10 MG TABS: 10 | 90 days supply | Qty: 180 | Fill #0

## 2020-02-05 MED FILL — CYCLOBENZAPRINE HCL 5 MG TA: 5 | 90 days supply | Qty: 90 | Fill #0

## 2020-02-12 MED FILL — DONEPEZIL HCL 10 MG TABLET: 10 | 90 days supply | Qty: 90 | Fill #1

## 2020-03-27 MED FILL — MEMANTINE HCL 10 MG TABS: 10 | 90 days supply | Qty: 180 | Fill #0

## 2020-03-27 MED FILL — SERTRALINE HCL 50 MG TABLET: 50 | 90 days supply | Qty: 180 | Fill #0

## 2020-05-06 MED FILL — DONEPEZIL HCL 10 MG TABLET: 10 | 90 days supply | Qty: 90 | Fill #0

## 2020-05-06 MED FILL — CYCLOBENZAPRINE HCL 5 MG TA: 5 | 90 days supply | Qty: 90 | Fill #0

## 2020-06-11 MED FILL — FLUOCINOLONE ACETONIDE SCAL: 0.01 | 20 days supply | Qty: 118 | Fill #0

## 2020-06-19 MED FILL — MEMANTINE HCL 10 MG TABS: 10 | 90 days supply | Qty: 180 | Fill #1

## 2020-06-19 MED FILL — SERTRALINE HCL 50 MG TABS: 50 | 90 days supply | Qty: 180 | Fill #1

## 2020-07-26 MED FILL — DONEPEZIL HCL 10 MG TABLET: 10 | 90 days supply | Qty: 90 | Fill #1

## 2020-07-26 MED FILL — CYCLOBENZAPRINE HCL 5 MG TA: 5 | 90 days supply | Qty: 90 | Fill #0

## 2020-09-19 ENCOUNTER — Other Ambulatory Visit (HOSPITAL_BASED_OUTPATIENT_CLINIC_OR_DEPARTMENT_OTHER): Payer: Self-pay | Admitting: Physician Assistant

## 2020-09-19 MED FILL — MEMANTINE HCL 10 MG TABS: 10 | 90 days supply | Qty: 180 | Fill #0

## 2020-09-19 MED FILL — SERTRALINE HCL 50 MG TABLET: 50 | 90 days supply | Qty: 180 | Fill #0

## 2020-09-20 ENCOUNTER — Ambulatory Visit: Payer: Medicare Other | Attending: Internal Medicine

## 2020-09-20 ENCOUNTER — Other Ambulatory Visit (HOSPITAL_BASED_OUTPATIENT_CLINIC_OR_DEPARTMENT_OTHER): Payer: Self-pay | Admitting: Internal Medicine

## 2020-09-20 DIAGNOSIS — Z23 Encounter for immunization: Secondary | ICD-10-CM

## 2020-09-20 MED FILL — FLUAD QUADRIVALENT 0.5 ML P: 0.5 | 1 days supply | Qty: 1 | Fill #0

## 2020-09-20 NOTE — Progress Notes (Signed)
   Covid-19 Vaccination Clinic  Name:  Kristin Conrad    MRN: [id number] DOB: 19-Sep-1930  09/20/2020  Kristin Conrad was observed post Covid-19 immunization for 15 minutes without incident. She was provided with Vaccine Information Sheet and instruction to access the V-Safe system. Vaccinated By: Arma Heading  Kristin Conrad was instructed to call 911 with any severe reactions post vaccine: Marland Kitchen Difficulty breathing  . Swelling of face and throat  . A fast heartbeat  . A bad rash all over body  . Dizziness and weakness

## 2020-11-22 ENCOUNTER — Other Ambulatory Visit: Payer: Self-pay | Admitting: Emergency Medicine

## 2020-11-22 ENCOUNTER — Emergency Department (INDEPENDENT_AMBULATORY_CARE_PROVIDER_SITE_OTHER)
Admission: EM | Admit: 2020-11-22 | Discharge: 2020-11-22 | Disposition: A | Discharge status: Home / Self Care | Payer: Medicare Other | Source: Home / Self Care

## 2020-11-22 ENCOUNTER — Other Ambulatory Visit: Payer: Self-pay

## 2020-11-22 DIAGNOSIS — L03115 Cellulitis of right lower limb: Secondary | ICD-10-CM | POA: Diagnosis not present

## 2020-11-22 MED ORDER — DOXYCYCLINE HYCLATE 100 MG PO CAPS: 100.0000 mg | ORAL_CAPSULE | Freq: Two times a day (BID) | ORAL | 0 refills | Status: DC

## 2020-11-22 MED FILL — DOXYCYCLINE HYCLATE 100 MG: 100 | 7 days supply | Qty: 14 | Fill #0

## 2020-11-22 NOTE — ED Triage Notes (Signed)
Pt presents with R knee rash. Redness and 2 lumps present on R knee, painful to touch and hot. Patient denies using any cream or medication on rash. Pt states that this rash has been present for about a week.

## 2020-11-22 NOTE — ED Provider Notes (Signed)
[facility]    CSN: [id number] Arrival date & time: 11/22/20  0948      History   Chief Complaint Chief Complaint  Patient presents with  . Rash    R knee     HPI Kristin Conrad is a 86 y.o. female.   HPI  Kristin Conrad is a 86 y.o. female presenting to UC with c/o gradually worsening redness, pain and swelling over Right knee for 1 week.  Hot to touch. No bleeding or drainage.  No treatments tried PTA. Pt unsure if she was bit by anything.  Denies fever, chills, n/v/d.  Pt uses walker to ambulate due to hx of Left drop foot.    Past Medical History:  Diagnosis Date  . COPD (chronic obstructive pulmonary disease) (HCC)   . Left foot drop   . Lone atrial fibrillation (HCC)   . Peripheral neuropathy   . TIA (transient ischemic attack)     Patient Active Problem List   Diagnosis Date Noted  . Leukocytosis   . Agitation 05/08/2017  . Sundowning 05/08/2017  . Bilateral neuropathy of upper extremities   . Acute metabolic encephalopathy 05/04/2017  . Fall 05/03/2017  . Shoulder dislocation 05/03/2017  . Hypokalemia 05/03/2017  . COPD (chronic obstructive pulmonary disease) (HCC) 05/03/2017  . Acute lower UTI 05/03/2017  . Delirium 05/03/2017    Past Surgical History:  Procedure Laterality Date  . BACK SURGERY    . HIP SURGERY    . JOINT REPLACEMENT      OB History   No obstetric history on file.      Home Medications    Prior to Admission medications   Medication Sig Start Date End Date Taking? Authorizing Provider  Albuterol Sulfate (PROAIR RESPICLICK) 108 (90 Base) MCG/ACT AEPB USE 2 INHALATIONS EVERY 4 HOURS AS NEEDED FOR WHEEZING OR SHORTNESS OF BREATH/COUGH 04/22/20  Yes [provider]  donepezil (ARICEPT) 10 MG tablet Take by mouth. 09/19/20  Yes [provider]  losartan (COZAAR) 25 MG tablet Take 1 tablet by mouth daily. 09/30/20  Yes [provider]  acetaminophen (TYLENOL) 500 MG tablet Take 2 tablets  (1,000 mg total) by mouth 3 (three) times daily. 05/11/17   Rodolph Bong, MD  amLODipine (NORVASC) 5 MG tablet Take 1 tablet (5 mg total) by mouth daily. 05/12/17   Rodolph Bong, MD  carvedilol (COREG) 3.125 MG tablet Take 1 tablet (3.125 mg total) by mouth 2 (two) times daily with a meal. 05/11/17   Rodolph Bong, MD  celecoxib (CELEBREX) 200 MG capsule Take 200 mg by mouth every morning.    [provider]  clopidogrel (PLAVIX) 75 MG tablet Take 75 mg by mouth 3 (three) times a week. Monday, Wednesday and Friday    [provider]  cyclobenzaprine (FLEXERIL) 10 MG tablet Take 10 mg by mouth 3 (three) times daily as needed for muscle spasms.    [provider]  doxycycline (VIBRAMYCIN) 100 MG capsule Take 1 capsule (100 mg total) by mouth 2 (two) times daily for 7 days. 11/22/20 11/29/20  Lurene Shadow, PA-C  ESTRACE VAGINAL 0.1 MG/GM vaginal cream Place vaginally. 11/07/20   [provider]  fexofenadine (ALLEGRA) 180 MG tablet Take 180 mg by mouth daily as needed for allergies or rhinitis.    [provider]  Fluticasone-Salmeterol (ADVAIR) 250-50 MCG/DOSE AEPB Inhale 1 puff into the lungs 2 (two) times daily as needed (shortness of breath).    [provider]  furosemide (LASIX) 40 MG tablet Take 40 mg by mouth daily as needed for fluid or edema.    [provider]  gabapentin (NEURONTIN) 600 MG tablet Take 600 mg by mouth 2 (two) times daily.    [provider]  HYDROcodone-acetaminophen (HYCET) 7.5-325 mg/15 ml solution Take 10 mLs by mouth at bedtime. 05/11/17   Rodolph Bong, MD  Ipratropium-Albuterol (COMBIVENT RESPIMAT) 20-100 MCG/ACT AERS respimat Inhale 1 puff into the lungs every 6 (six) hours as needed for wheezing or shortness of breath.    [provider]  mirabegron ER (MYRBETRIQ) 50 MG TB24 tablet Take 50 mg by mouth daily.    [provider]  montelukast (SINGULAIR) 10 MG  tablet Take 10 mg by mouth daily as needed (allergies).    [provider]  OLANZapine zydis (ZYPREXA) 5 MG disintegrating tablet Take 1 tablet (5 mg total) by mouth at bedtime. 05/11/17   Rodolph Bong, MD  polyethylene glycol [facility]) packet Take 17 g by mouth daily. 05/11/17   Rodolph Bong, MD  potassium chloride (K-DUR,KLOR-CON) 10 MEQ tablet Take 10 mEq by mouth daily as needed (when taking lasix).    [provider]  PROAIR RESPICLICK 108 [phone] Base) MCG/ACT AEPB Inhale into the lungs. 11/12/20   [provider]  rosuvastatin (CRESTOR) 10 MG tablet Take 10 mg by mouth daily.    [provider]    Family History Family History  Problem Relation Age of Onset  . Stroke Father     Social History Social History   Tobacco Use  . Smoking status: Never Smoker  . Smokeless tobacco: Never Used  Substance Use Topics  . Alcohol use: No  . Drug use: No     Allergies   Codeine, Penicillins, and Sulfa antibiotics   Review of Systems Review of Systems  Constitutional: Negative for chills and fever.  Musculoskeletal: Negative for arthralgias.  Skin: Positive for color change and wound.     Physical Exam Triage Vital Signs ED Triage Vitals  Enc Vitals Group     BP 11/22/20 1012 118/74     Pulse Rate 11/22/20 1012 74     Resp 11/22/20 1012 18     Temp 11/22/20 1012 97.6 F (36.4 C)     Temp Source 11/22/20 1012 Oral     SpO2 11/22/20 1012 98 %     Weight --      Height --      Head Circumference --      Peak Flow --      Pain Score 11/22/20 1008 0     Pain Loc --      Pain Edu? --      Excl. in GC? --    No data found.  Updated Vital Signs BP 118/74 (BP Location: Left Arm)   Pulse 74   Temp 97.6 F (36.4 C) (Oral)   Resp 18   SpO2 98%   Visual Acuity Right Eye Distance:   Left Eye Distance:   Bilateral Distance:    Right Eye Near:   Left Eye Near:    Bilateral Near:     Physical Exam Vitals and nursing note  reviewed.  Constitutional:      Appearance: Normal appearance. She is well-developed.  HENT:     Head: Normocephalic and atraumatic.  Cardiovascular:     Rate and Rhythm: Normal rate.  Pulmonary:     Effort: Pulmonary effort is normal.  Musculoskeletal:  General: No swelling or tenderness. Normal range of motion.     Cervical back: Normal range of motion.     Comments: Right knee: no joint swelling. No bony or joint space tenderness.   Skin:    General: Skin is warm and dry.     Findings: Erythema present.       Neurological:     Mental Status: She is alert and oriented to person, place, and time.  Psychiatric:        Behavior: Behavior normal.      UC Treatments / Results  Labs (all labs ordered are listed, but only abnormal results are displayed) Labs Reviewed - No data to display  EKG   Radiology No results found.  Procedures Procedures (including critical care time)  Medications Ordered in UC Medications - No data to display  Initial Impression / Assessment and Plan / UC Course  I have reviewed the triage vital signs and the nursing notes.  Pertinent labs & imaging results that were available during my care of the patient were reviewed by me and considered in my medical decision making (see chart for details).     Hx and exam c/w cellulitis of Right knee without evidence of septic joint Rx: doxycycline Discussed home care F/u in 3-4 days if not improving, sooner if worsening.   Final Clinical Impressions(s) / UC Diagnoses   Final diagnoses:  Cellulitis of right knee     Discharge Instructions      Please take antibiotics as prescribed and be sure to complete entire course even if you start to feel better to ensure infection does not come back.  Apply a warm damp washcloth to area 2-3 times daily for 10-15 minutes at a time.  Follow up in 3-4 days for recheck with primary care.  You may return to urgent care if needed.    ED  Prescriptions    Medication Sig Dispense Auth. Provider   doxycycline (VIBRAMYCIN) 100 MG capsule Take 1 capsule (100 mg total) by mouth 2 (two) times daily for 7 days. 14 capsule Lurene Shadow, [place]     PDMP not reviewed this encounter.   Lurene Shadow, [place] 11/22/20 1930

## 2020-11-22 NOTE — Discharge Instructions (Signed)
  Please take antibiotics as prescribed and be sure to complete entire course even if you start to feel better to ensure infection does not come back.  Apply a warm damp washcloth to area 2-3 times daily for 10-15 minutes at a time.  Follow up in 3-4 days for recheck with primary care.  You may return to urgent care if needed.

## 2020-11-25 ENCOUNTER — Other Ambulatory Visit (HOSPITAL_BASED_OUTPATIENT_CLINIC_OR_DEPARTMENT_OTHER): Payer: Self-pay | Admitting: Obstetrics and Gynecology

## 2020-11-25 MED FILL — DONEPEZIL HCL 10 MG TABLET: 10 | 90 days supply | Qty: 90 | Fill #0

## 2020-11-26 MED FILL — MYRBETRIQ ER 25 MG TABLET: 25 | 20 days supply | Qty: 20 | Fill #0

## 2021-01-29 MED FILL — SERTRALINE HCL 50 MG TABLET: 50 | 90 days supply | Qty: 180 | Fill #1

## 2021-01-29 MED FILL — MEMANTINE HCL 10 MG TABS: 10 | 90 days supply | Qty: 180 | Fill #1

## 2021-03-19 ENCOUNTER — Other Ambulatory Visit (HOSPITAL_BASED_OUTPATIENT_CLINIC_OR_DEPARTMENT_OTHER): Payer: Self-pay | Admitting: Physician Assistant

## 2021-03-19 MED FILL — DONEPEZIL HCL 10 MG TABLET: 10 | 90 days supply | Qty: 90 | Fill #0

## 2021-05-10 ENCOUNTER — Other Ambulatory Visit (HOSPITAL_BASED_OUTPATIENT_CLINIC_OR_DEPARTMENT_OTHER): Payer: Self-pay

## 2021-05-12 ENCOUNTER — Other Ambulatory Visit (HOSPITAL_BASED_OUTPATIENT_CLINIC_OR_DEPARTMENT_OTHER): Payer: Self-pay

## 2021-05-13 ENCOUNTER — Other Ambulatory Visit (HOSPITAL_BASED_OUTPATIENT_CLINIC_OR_DEPARTMENT_OTHER): Payer: Self-pay

## 2021-05-14 ENCOUNTER — Other Ambulatory Visit (HOSPITAL_BASED_OUTPATIENT_CLINIC_OR_DEPARTMENT_OTHER): Payer: Self-pay

## 2021-05-14 MED ORDER — MEMANTINE HCL 10 MG PO TABS
10.0000 mg | ORAL_TABLET | Freq: Two times a day (BID) | ORAL | 1 refills | Status: AC
  Filled 2021-05-14: qty 180, 90d supply, fill #0
  Filled 2021-08-12: qty 180, 90d supply, fill #1

## 2021-05-14 MED ORDER — SERTRALINE HCL 50 MG PO TABS
2.0000 | ORAL_TABLET | Freq: Every day | ORAL | 1 refills | Status: AC
  Filled 2021-05-14: qty 180, 90d supply, fill #0
  Filled 2021-08-12: qty 180, 90d supply, fill #1

## 2021-05-26 ENCOUNTER — Other Ambulatory Visit (HOSPITAL_BASED_OUTPATIENT_CLINIC_OR_DEPARTMENT_OTHER): Payer: Self-pay

## 2021-05-26 MED ORDER — CLOBETASOL PROPIONATE 0.05 % EX CREA
TOPICAL_CREAM | CUTANEOUS | 0 refills | Status: DC
  Filled 2021-05-26: qty 60, 30d supply, fill #0

## 2021-05-28 ENCOUNTER — Other Ambulatory Visit (HOSPITAL_BASED_OUTPATIENT_CLINIC_OR_DEPARTMENT_OTHER): Payer: Self-pay

## 2021-05-28 MED ORDER — BENZONATATE 100 MG PO CAPS
ORAL_CAPSULE | ORAL | 0 refills | Status: AC
  Filled 2021-05-28: qty 100, 17d supply, fill #0

## 2021-06-11 ENCOUNTER — Ambulatory Visit: Payer: Medicare Other | Attending: Internal Medicine

## 2021-06-11 DIAGNOSIS — Z23 Encounter for immunization: Secondary | ICD-10-CM

## 2021-06-11 NOTE — Progress Notes (Signed)
   Covid-19 Vaccination Clinic  Name:  Kristin Conrad    MRN: [id number] DOB: 11-12-1930  06/11/2021  Kristin Conrad was observed post Covid-19 immunization for 15 minutes without incident. She was provided with Vaccine Information Sheet and instruction to access the V-Safe system.   Kristin Conrad was instructed to call 911 with any severe reactions post vaccine: Difficulty breathing  Swelling of face and throat  A fast heartbeat  A bad rash all over body  Dizziness and weakness   Immunizations Administered     Name Date Dose VIS Date Route   PFIZER Comrnaty(Gray TOP) Covid-19 Vaccine 06/11/2021 11:54 AM 0.3 mL 11/28/2020 Intramuscular   Manufacturer: [employer], [employer]   Lot: [id number]   NDC: [phone]

## 2021-06-12 ENCOUNTER — Other Ambulatory Visit (HOSPITAL_BASED_OUTPATIENT_CLINIC_OR_DEPARTMENT_OTHER): Payer: Self-pay

## 2021-06-12 MED ORDER — COVID-19 MRNA VAC-TRIS(PFIZER) 30 MCG/0.3ML IM SUSP
INTRAMUSCULAR | 0 refills | Status: AC
  Filled 2021-06-12: qty 0.3, 1d supply, fill #0

## 2021-07-17 ENCOUNTER — Other Ambulatory Visit (HOSPITAL_BASED_OUTPATIENT_CLINIC_OR_DEPARTMENT_OTHER): Payer: Self-pay

## 2021-08-12 ENCOUNTER — Other Ambulatory Visit (HOSPITAL_BASED_OUTPATIENT_CLINIC_OR_DEPARTMENT_OTHER): Payer: Self-pay

## 2021-08-12 MED FILL — Donepezil Hydrochloride Tab 10 MG: ORAL | 90 days supply | Qty: 90 | Fill #0 | Status: AC

## 2021-08-29 ENCOUNTER — Other Ambulatory Visit (HOSPITAL_BASED_OUTPATIENT_CLINIC_OR_DEPARTMENT_OTHER): Payer: Self-pay

## 2021-08-29 MED ORDER — CLOBETASOL PROPIONATE 0.05 % EX CREA
TOPICAL_CREAM | CUTANEOUS | 3 refills | Status: AC
  Filled 2021-08-29: qty 60, 30d supply, fill #0

## 2021-09-22 ENCOUNTER — Other Ambulatory Visit (HOSPITAL_BASED_OUTPATIENT_CLINIC_OR_DEPARTMENT_OTHER): Payer: Self-pay

## 2021-09-22 MED ORDER — DONEPEZIL HCL 10 MG PO TABS
ORAL_TABLET | ORAL | 1 refills | Status: AC
  Filled 2021-09-22: qty 90, 90d supply, fill #0

## 2021-09-22 MED ORDER — SERTRALINE HCL 50 MG PO TABS
ORAL_TABLET | ORAL | 1 refills | Status: AC
  Filled 2021-09-22: qty 180, 90d supply, fill #0

## 2021-09-22 MED ORDER — MEMANTINE HCL 10 MG PO TABS
ORAL_TABLET | ORAL | 1 refills | Status: AC
  Filled 2021-09-22: qty 180, 90d supply, fill #0

## 2021-10-17 ENCOUNTER — Other Ambulatory Visit (HOSPITAL_BASED_OUTPATIENT_CLINIC_OR_DEPARTMENT_OTHER): Payer: Self-pay

## 2021-10-17 ENCOUNTER — Ambulatory Visit: Payer: Medicare Other | Attending: Internal Medicine

## 2021-10-17 DIAGNOSIS — Z23 Encounter for immunization: Secondary | ICD-10-CM

## 2021-10-17 MED ORDER — INFLUENZA VAC A&B SA ADJ QUAD 0.5 ML IM PRSY
PREFILLED_SYRINGE | INTRAMUSCULAR | 0 refills | Status: AC
  Filled 2021-10-17: qty 0.5, 1d supply, fill #0

## 2021-10-17 NOTE — Progress Notes (Signed)
   Covid-19 Vaccination Clinic  Name:  Kristin Conrad    MRN: [id number] DOB: 1930-03-27  10/17/2021  [name] was observed post Covid-19 immunization for 15 minutes without incident. She was provided with Vaccine Information Sheet and instruction to access the V-Safe system.   [name] was instructed to call 911 with any severe reactions post vaccine: Difficulty breathing  Swelling of face and throat  A fast heartbeat  A bad rash all over body  Dizziness and weakness   Immunizations Administered     Name Date Dose VIS Date Route   Pfizer Covid-19 Vaccine Bivalent Booster 10/17/2021 11:57 AM 0.3 mL 08/20/2021 Intramuscular   Manufacturer: [employer], [employer]   Lot: [id number]   NDC: [phone]

## 2021-10-23 ENCOUNTER — Other Ambulatory Visit (HOSPITAL_BASED_OUTPATIENT_CLINIC_OR_DEPARTMENT_OTHER): Payer: Self-pay

## 2021-11-10 ENCOUNTER — Other Ambulatory Visit (HOSPITAL_BASED_OUTPATIENT_CLINIC_OR_DEPARTMENT_OTHER): Payer: Self-pay

## 2021-11-10 MED ORDER — PFIZER COVID-19 VAC BIVALENT 30 MCG/0.3ML IM SUSP
INTRAMUSCULAR | 0 refills | Status: AC
  Filled 2021-11-10: qty 0.3, 1d supply, fill #0

## 2023-08-22 DEATH — deceased
